# Patient Record
Sex: Female | Born: 1990 | Race: Black or African American | Hispanic: No | Marital: Single | Smoking: Former smoker
Health system: Southern US, Community
[De-identification: ages and names within clinical notes are randomized; demographics above are authoritative.]

## PROBLEM LIST (undated history)

## (undated) DIAGNOSIS — O99019 Anemia complicating pregnancy, unspecified trimester: Secondary | ICD-10-CM

## (undated) DIAGNOSIS — E669 Obesity, unspecified: Secondary | ICD-10-CM

## (undated) HISTORY — PX: ARTHROSCOPIC REPAIR ACL: SUR80

## (undated) HISTORY — PX: SKIN GRAFT: SHX250

---
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
## 2012-08-01 ENCOUNTER — Emergency Department: Payer: Self-pay | Admitting: Emergency Medicine

## 2012-08-06 ENCOUNTER — Emergency Department: Payer: Self-pay | Admitting: Emergency Medicine

## 2012-11-23 ENCOUNTER — Encounter: Payer: Self-pay | Admitting: Maternal and Fetal Medicine

## 2013-01-21 ENCOUNTER — Ambulatory Visit: Payer: Self-pay | Admitting: Physician Assistant

## 2013-03-09 ENCOUNTER — Observation Stay: Payer: Self-pay | Admitting: Obstetrics and Gynecology

## 2013-03-17 ENCOUNTER — Observation Stay: Payer: Self-pay | Admitting: Obstetrics & Gynecology

## 2013-03-17 LAB — URINALYSIS, COMPLETE
Bilirubin,UR: NEGATIVE
Blood: NEGATIVE
Nitrite: NEGATIVE
Ph: 7 (ref 4.5–8.0)
Protein: NEGATIVE
RBC,UR: 1 /HPF (ref 0–5)
Specific Gravity: 1.017 (ref 1.003–1.030)
Squamous Epithelial: 3

## 2013-03-19 LAB — URINE CULTURE

## 2013-05-25 ENCOUNTER — Observation Stay: Payer: Self-pay | Admitting: Obstetrics and Gynecology

## 2013-05-25 LAB — URINALYSIS, COMPLETE
Bilirubin,UR: NEGATIVE
Blood: NEGATIVE
Glucose,UR: NEGATIVE mg/dL (ref 0–75)
Nitrite: NEGATIVE
Protein: NEGATIVE
RBC,UR: 3 /HPF (ref 0–5)
Specific Gravity: 1.013 (ref 1.003–1.030)

## 2013-05-27 LAB — URINE CULTURE

## 2013-06-12 ENCOUNTER — Inpatient Hospital Stay: Payer: Self-pay | Admitting: Obstetrics and Gynecology

## 2013-06-12 LAB — CBC WITH DIFFERENTIAL/PLATELET
Basophil #: 0 10*3/uL (ref 0.0–0.1)
Eosinophil %: 0.4 %
HCT: 33.2 % — ABNORMAL LOW (ref 35.0–47.0)
HGB: 11 g/dL — ABNORMAL LOW (ref 12.0–16.0)
Lymphocyte #: 1.1 10*3/uL (ref 1.0–3.6)
MCHC: 33.2 g/dL (ref 32.0–36.0)
MCV: 85 fL (ref 80–100)
Monocyte #: 0.7 x10 3/mm (ref 0.2–0.9)
Neutrophil #: 9.7 10*3/uL — ABNORMAL HIGH (ref 1.4–6.5)
Neutrophil %: 83.7 %
RBC: 3.89 10*6/uL (ref 3.80–5.20)
RDW: 13.4 % (ref 11.5–14.5)

## 2013-06-12 LAB — GC/CHLAMYDIA PROBE AMP

## 2013-06-13 LAB — HEMATOCRIT: HCT: 27.1 % — ABNORMAL LOW (ref 35.0–47.0)

## 2013-07-18 ENCOUNTER — Emergency Department: Payer: Self-pay | Admitting: Emergency Medicine

## 2014-11-04 ENCOUNTER — Emergency Department: Payer: Self-pay | Admitting: Emergency Medicine

## 2014-11-04 LAB — URINALYSIS, COMPLETE
Bilirubin,UR: NEGATIVE
Blood: NEGATIVE
Glucose,UR: NEGATIVE mg/dL (ref 0–75)
Ketone: NEGATIVE
NITRITE: NEGATIVE
Ph: 5 (ref 4.5–8.0)
Protein: 30
RBC,UR: 4 /HPF (ref 0–5)
SPECIFIC GRAVITY: 1.03 (ref 1.003–1.030)
WBC UR: 28 /HPF (ref 0–5)

## 2014-11-18 NOTE — L&D Delivery Note (Signed)
Obstetrical Delivery Note   Date of Delivery:   06/14/2015 at 1557 Primary OB:   [facility] Gestational Age/EDD: [redacted]w[redacted]d (Dated by LMP) Antepartum complications: anemia, marijuana use, obesity  Delivered By:   Farrel Conners, CNM  Delivery Type:   spontaneous vaginal delivery  Procedure Details:   SVD of viable female infant with nuchal cord x 2 reduced on perineum. Good cry. Baby placed on mother's chest and cord clamped after it was pulseless. FOB then cut cord. Baby placed skin to skin with mother. Anesthesia:    epidural Intrapartum complications: Inadequate labor-Pitocin enhanced labor GBS:    negative Laceration:    none Episiotomy:    none Placenta:    Via active 3rd stage. To pathology: no Estimated Blood Loss:  400 ml  Baby:    Liveborn female, Apgars 8/9, weight pending Roman/ Breast and bottle    Luceil Herrin, CNM

## 2015-01-30 ENCOUNTER — Emergency Department: Payer: Self-pay | Admitting: Emergency Medicine

## 2015-02-03 LAB — OB RESULTS CONSOLE HIV ANTIBODY (ROUTINE TESTING): HIV: NONREACTIVE

## 2015-02-03 LAB — OB RESULTS CONSOLE RPR: RPR: NONREACTIVE

## 2015-02-03 LAB — OB RESULTS CONSOLE VARICELLA ZOSTER ANTIBODY, IGG: Varicella: IMMUNE

## 2015-02-03 LAB — OB RESULTS CONSOLE HEPATITIS B SURFACE ANTIGEN: Hepatitis B Surface Ag: NEGATIVE

## 2015-02-03 LAB — OB RESULTS CONSOLE RUBELLA ANTIBODY, IGM: RUBELLA: IMMUNE

## 2015-03-24 LAB — OB RESULTS CONSOLE HIV ANTIBODY (ROUTINE TESTING): HIV: NONREACTIVE

## 2015-03-24 LAB — OB RESULTS CONSOLE RPR: RPR: NONREACTIVE

## 2015-03-28 NOTE — H&P (Signed)
L&D Evaluation:  History:  HPI 24 yo G1 at 3432w5d by 11w US derived EDC of 06/11/13.  Patient present for decreased fetal movement last movement yesterday.  Has not felt movement until being placed on monitor today.  Also reports some right sides flank/groin pain, positional.  No dysuria, no fevers, no chills.  Denies contractions or other abdominal pain.  No LOF, no VB.   Presents with decreased fetal movement   Patient's Medical History Asthma  obesity   Patient's Surgical History acl repair   Medications Pre Natal Vitamins   Allergies NKDA   Social History tobacco   Family History Non-Contributory   ROS:  ROS All systems were reviewed.  HEENT, CNS, GI, GU, Respiratory, CV, Renal and Musculoskeletal systems were found to be normal.   Exam:  Vital Signs stable   Urine Protein not completed   General no apparent distress   Mental Status clear    Chest no increased work of breathing    Abdomen gravid, non-tender   Estimated Fetal Weight Average for gestational age   Edema no edema    FHT normal rate with no decels, 140, mod, no accels, no decels   Ucx absent   Impression:  Impression decreased fetal movement   Plan:  Plan EFM/NST   Comments - NST appropriate for 26 weeks.  Good fetal movement felt by patient during monitoring. - Discharge home follow up first week of May   Follow Up Appointment already scheduled   Electronic Signatures: Lorrene ReidStaebler, Malya Cirillo M (MD)  (Signed 23-Apr-14 00:30)  Authored: L&D Evaluation   Last Updated: 23-Apr-14 00:30 by Lorrene ReidStaebler, Jesse Hirst M (MD)

## 2015-03-28 NOTE — H&P (Signed)
L&D Evaluation:  History:  HPI 24yo G1 at 60w6d11wk U/S who presents with c/o decreased FM, dysuria and vaginal discharge x 2 days.  No labor complaints.  Now feeling baby move here on L&D.    PNC at [facility].  PNL - O+, RI (MMR x 2), GC/chlam neg, 1hr GTT 89, VI.   Patient's Medical History No Chronic Illness   Patient's Surgical History R knee ACL repair   Medications Pre Natal Vitamins   Allergies NKDA   Social History prior tobacco   Family History Non-Contributory   ROS:  ROS All systems were reviewed.  HEENT, CNS, GI, GU, Respiratory, CV, Renal and Musculoskeletal systems were found to be normal., except as noted in HPI   Exam:  Vital Signs stable   General no apparent distress   Mental Status clear   Chest normal effort   Abdomen gravid, non-tender   Estimated Fetal Weight Average for gestational age   Back no CVAT   Edema no edema   Pelvic no external lesions, cervix closed and thick   Mebranes Intact   FHT normal rate with no decels   Ucx absent   Skin dry   Lymph no lymphadenopathy   Other UA negative except for 2+ leuks   Impression:  Impression 24yo G1 at 260w6dith physiologic discharge   Plan:  Comments - reassurance provided - urine culture - f/u GC/chlam - PTL precautions   Follow Up Appointment already scheduled. in 1 week. at [facility]   Electronic Signatures: StThurmond ButtsErElinor ParkinsonMD)  (Signed 30-Apr-14 11:46)  Authored: L&D Evaluation   Last Updated: 30-Apr-14 11:46 by StThurmond ButtsErElinor ParkinsonMD)

## 2015-05-19 LAB — OB RESULTS CONSOLE GBS: GBS: NEGATIVE

## 2015-05-19 LAB — OB RESULTS CONSOLE GC/CHLAMYDIA
Chlamydia: NEGATIVE
GC PROBE AMP, GENITAL: NEGATIVE

## 2015-06-14 ENCOUNTER — Inpatient Hospital Stay: Payer: Medicaid Other | Admitting: Anesthesiology

## 2015-06-14 ENCOUNTER — Inpatient Hospital Stay
Admission: EM | Admit: 2015-06-14 | Discharge: 2015-06-16 | DRG: 775 | Disposition: A | Discharge status: Home / Self Care | Payer: Medicaid Other | Attending: Certified Nurse Midwife | Admitting: Certified Nurse Midwife

## 2015-06-14 DIAGNOSIS — O9902 Anemia complicating childbirth: Secondary | ICD-10-CM | POA: Diagnosis present

## 2015-06-14 DIAGNOSIS — O99324 Drug use complicating childbirth: Secondary | ICD-10-CM | POA: Diagnosis present

## 2015-06-14 DIAGNOSIS — E669 Obesity, unspecified: Secondary | ICD-10-CM | POA: Diagnosis present

## 2015-06-14 DIAGNOSIS — Z6833 Body mass index (BMI) 33.0-33.9, adult: Secondary | ICD-10-CM | POA: Diagnosis not present

## 2015-06-14 DIAGNOSIS — Z3A39 39 weeks gestation of pregnancy: Secondary | ICD-10-CM | POA: Diagnosis present

## 2015-06-14 DIAGNOSIS — D62 Acute posthemorrhagic anemia: Secondary | ICD-10-CM | POA: Diagnosis not present

## 2015-06-14 DIAGNOSIS — O99214 Obesity complicating childbirth: Secondary | ICD-10-CM | POA: Diagnosis present

## 2015-06-14 DIAGNOSIS — Z3490 Encounter for supervision of normal pregnancy, unspecified, unspecified trimester: Secondary | ICD-10-CM

## 2015-06-14 DIAGNOSIS — F1290 Cannabis use, unspecified, uncomplicated: Secondary | ICD-10-CM | POA: Diagnosis present

## 2015-06-14 DIAGNOSIS — Z87891 Personal history of nicotine dependence: Secondary | ICD-10-CM | POA: Diagnosis not present

## 2015-06-14 DIAGNOSIS — D649 Anemia, unspecified: Secondary | ICD-10-CM | POA: Diagnosis present

## 2015-06-14 HISTORY — DX: Obesity, unspecified: E66.9

## 2015-06-14 HISTORY — DX: Anemia complicating pregnancy, unspecified trimester: O99.019

## 2015-06-14 LAB — URINE DRUG SCREEN, QUALITATIVE (ARMC ONLY)
Amphetamines, Ur Screen: NEGATIVE — AB
BARBITURATES, UR SCREEN: NEGATIVE — AB
Benzodiazepine, Ur Scrn: NEGATIVE — AB
Cannabinoid 50 Ng, Ur ~~LOC~~: POSITIVE — AB
Cocaine Metabolite,Ur ~~LOC~~: NEGATIVE — AB
MDMA (ECSTASY) UR SCREEN: NEGATIVE — AB
Methadone Scn, Ur: NEGATIVE — AB
Opiate, Ur Screen: NEGATIVE — AB
Phencyclidine (PCP) Ur S: NEGATIVE — AB
Tricyclic, Ur Screen: NEGATIVE — AB

## 2015-06-14 LAB — TYPE AND SCREEN
ABO/RH(D): O POS
ANTIBODY SCREEN: NEGATIVE

## 2015-06-14 LAB — CBC
HCT: 30.6 % — ABNORMAL LOW (ref 35.0–47.0)
Hemoglobin: 9.7 g/dL — ABNORMAL LOW (ref 12.0–16.0)
MCH: 25.5 pg — AB (ref 26.0–34.0)
MCHC: 31.6 g/dL — ABNORMAL LOW (ref 32.0–36.0)
MCV: 80.8 fL (ref 80.0–100.0)
PLATELETS: 106 10*3/uL — AB (ref 150–440)
RBC: 3.78 MIL/uL — AB (ref 3.80–5.20)
RDW: 15.6 % — ABNORMAL HIGH (ref 11.5–14.5)
WBC: 7.3 10*3/uL (ref 3.6–11.0)

## 2015-06-14 LAB — CHLAMYDIA/NGC RT PCR (ARMC ONLY)
CHLAMYDIA TR: NOT DETECTED
N gonorrhoeae: NOT DETECTED

## 2015-06-14 LAB — ABO/RH: ABO/RH(D): O POS

## 2015-06-14 MED ORDER — SODIUM CHLORIDE 0.9 % IJ SOLN
INTRAMUSCULAR | Status: AC
  Filled 2015-06-14: qty 10

## 2015-06-14 MED ORDER — ONDANSETRON HCL 4 MG/2ML IJ SOLN
4.0000 mg | Freq: Four times a day (QID) | INTRAMUSCULAR | Status: DC | PRN
  Administered 2015-06-14: 4 mg via INTRAVENOUS
  Filled 2015-06-14: qty 2

## 2015-06-14 MED ORDER — MISOPROSTOL 200 MCG PO TABS
800.0000 ug | ORAL_TABLET | Freq: Once | ORAL | Status: DC | PRN
  Filled 2015-06-14: qty 4

## 2015-06-14 MED ORDER — DIPHENHYDRAMINE HCL 50 MG/ML IJ SOLN: 12.5000 mg | INTRAMUSCULAR | Status: DC | PRN

## 2015-06-14 MED ORDER — FERROUS SULFATE 325 (65 FE) MG PO TABS
325.0000 mg | ORAL_TABLET | Freq: Every day | ORAL | Status: DC
  Administered 2015-06-15: 325 mg via ORAL
  Filled 2015-06-14 (×2): qty 1

## 2015-06-14 MED ORDER — OXYCODONE-ACETAMINOPHEN 5-325 MG PO TABS
2.0000 | ORAL_TABLET | ORAL | Status: DC | PRN
  Filled 2015-06-14: qty 2

## 2015-06-14 MED ORDER — WITCH HAZEL-GLYCERIN EX PADS: 1.0000 "application " | MEDICATED_PAD | CUTANEOUS | Status: DC | PRN

## 2015-06-14 MED ORDER — TERBUTALINE SULFATE 1 MG/ML IJ SOLN
0.2500 mg | Freq: Once | INTRAMUSCULAR | Status: DC | PRN
  Filled 2015-06-14: qty 1

## 2015-06-14 MED ORDER — OXYCODONE-ACETAMINOPHEN 5-325 MG PO TABS
1.0000 | ORAL_TABLET | ORAL | Status: DC | PRN
  Administered 2015-06-15 – 2015-06-16 (×2): 1 via ORAL
  Filled 2015-06-14: qty 1

## 2015-06-14 MED ORDER — IBUPROFEN 600 MG PO TABS
600.0000 mg | ORAL_TABLET | Freq: Four times a day (QID) | ORAL | Status: DC
  Administered 2015-06-14: 600 mg via ORAL

## 2015-06-14 MED ORDER — LACTATED RINGERS IV SOLN
INTRAVENOUS | Status: DC
  Administered 2015-06-14 (×2): via INTRAVENOUS

## 2015-06-14 MED ORDER — OXYTOCIN 40 UNITS IN LACTATED RINGERS INFUSION - SIMPLE MED
1.0000 m[IU]/min | INTRAVENOUS | Status: DC
  Administered 2015-06-14: 1 m[IU]/min via INTRAVENOUS

## 2015-06-14 MED ORDER — LACTATED RINGERS IV SOLN: 500.0000 mL | INTRAVENOUS | Status: DC | PRN

## 2015-06-14 MED ORDER — LIDOCAINE HCL (PF) 1 % IJ SOLN
INTRAMUSCULAR | Status: DC | PRN
  Administered 2015-06-14: 3 mL via INTRADERMAL

## 2015-06-14 MED ORDER — OXYTOCIN 40 UNITS IN LACTATED RINGERS INFUSION - SIMPLE MED: 62.5000 mL/h | INTRAVENOUS | Status: DC | PRN

## 2015-06-14 MED ORDER — PHENYLEPHRINE 40 MCG/ML (10ML) SYRINGE FOR IV PUSH (FOR BLOOD PRESSURE SUPPORT)
80.0000 ug | PREFILLED_SYRINGE | INTRAVENOUS | Status: DC | PRN
  Filled 2015-06-14: qty 2

## 2015-06-14 MED ORDER — PRENATAL MULTIVITAMIN CH
1.0000 | ORAL_TABLET | Freq: Every day | ORAL | Status: DC
  Administered 2015-06-15: 1 via ORAL
  Filled 2015-06-14: qty 1

## 2015-06-14 MED ORDER — EPHEDRINE 5 MG/ML INJ
10.0000 mg | INTRAVENOUS | Status: DC | PRN
  Administered 2015-06-14: 10 mg via INTRAVENOUS
  Filled 2015-06-14: qty 2

## 2015-06-14 MED ORDER — OXYTOCIN BOLUS FROM INFUSION: 500.0000 mL | INTRAVENOUS | Status: DC

## 2015-06-14 MED ORDER — ONDANSETRON HCL 4 MG/2ML IJ SOLN: 4.0000 mg | INTRAMUSCULAR | Status: DC | PRN

## 2015-06-14 MED ORDER — BUPIVACAINE HCL (PF) 0.25 % IJ SOLN
INTRAMUSCULAR | Status: DC | PRN
  Administered 2015-06-14 (×2): 5 mL

## 2015-06-14 MED ORDER — EPHEDRINE SULFATE 50 MG/ML IJ SOLN
INTRAMUSCULAR | Status: AC
  Filled 2015-06-14: qty 1

## 2015-06-14 MED ORDER — SIMETHICONE 80 MG PO CHEW: 80.0000 mg | CHEWABLE_TABLET | ORAL | Status: DC | PRN

## 2015-06-14 MED ORDER — DIBUCAINE 1 % RE OINT: 1.0000 "application " | TOPICAL_OINTMENT | RECTAL | Status: DC | PRN

## 2015-06-14 MED ORDER — LIDOCAINE-EPINEPHRINE (PF) 1.5 %-1:200000 IJ SOLN
INTRAMUSCULAR | Status: DC | PRN
  Administered 2015-06-14: 3 mL via EPIDURAL

## 2015-06-14 MED ORDER — FENTANYL CITRATE (PF) 100 MCG/2ML IJ SOLN
50.0000 ug | INTRAMUSCULAR | Status: DC | PRN
  Administered 2015-06-14 (×2): 100 ug via INTRAVENOUS
  Filled 2015-06-14 (×2): qty 2

## 2015-06-14 MED ORDER — ONDANSETRON HCL 4 MG PO TABS: 4.0000 mg | ORAL_TABLET | ORAL | Status: DC | PRN

## 2015-06-14 MED ORDER — BENZOCAINE-MENTHOL 20-0.5 % EX AERO: 1.0000 "application " | INHALATION_SPRAY | CUTANEOUS | Status: DC | PRN

## 2015-06-14 MED ORDER — LIDOCAINE HCL (PF) 1 % IJ SOLN
30.0000 mL | INTRAMUSCULAR | Status: DC | PRN
  Filled 2015-06-14: qty 30

## 2015-06-14 MED ORDER — AMMONIA AROMATIC IN INHA
0.3000 mL | Freq: Once | RESPIRATORY_TRACT | Status: DC | PRN
  Filled 2015-06-14: qty 10

## 2015-06-14 MED ORDER — OXYTOCIN 40 UNITS IN LACTATED RINGERS INFUSION - SIMPLE MED
62.5000 mL/h | INTRAVENOUS | Status: DC
  Filled 2015-06-14: qty 1000

## 2015-06-14 MED ORDER — LANOLIN HYDROUS EX OINT: TOPICAL_OINTMENT | CUTANEOUS | Status: DC | PRN

## 2015-06-14 MED ORDER — DOCUSATE SODIUM 100 MG PO CAPS
100.0000 mg | ORAL_CAPSULE | Freq: Every day | ORAL | Status: DC
  Administered 2015-06-14 – 2015-06-15 (×2): 100 mg via ORAL
  Filled 2015-06-14 (×2): qty 1

## 2015-06-14 MED ORDER — IBUPROFEN 600 MG PO TABS
600.0000 mg | ORAL_TABLET | Freq: Four times a day (QID) | ORAL | Status: DC
  Administered 2015-06-15 – 2015-06-16 (×4): 600 mg via ORAL
  Filled 2015-06-14 (×4): qty 1

## 2015-06-14 MED ORDER — FENTANYL 2.5 MCG/ML W/ROPIVACAINE 0.2% IN NS 100 ML EPIDURAL INFUSION (ARMC-ANES)
9.0000 mL/h | EPIDURAL | Status: DC
  Administered 2015-06-14: 9 mL/h via EPIDURAL
  Filled 2015-06-14: qty 100

## 2015-06-14 NOTE — Anesthesia Preprocedure Evaluation (Addendum)
Anesthesia Evaluation  Patient identified by MRN, date of birth, ID band Patient awake    Reviewed: Allergy & Precautions, H&P , NPO status , Patient's Chart, lab work & pertinent test results, reviewed documented beta blocker date and time   Airway Mallampati: III  TM Distance: >3 FB Neck ROM: full    Dental no notable dental hx. (+) Teeth Intact   Pulmonary neg pulmonary ROS, former smoker,  breath sounds clear to auscultation  Pulmonary exam normal       Cardiovascular Exercise Tolerance: Good negative cardio ROS Normal cardiovascular examRhythm:regular Rate:Normal     Neuro/Psych negative neurological ROS  negative psych ROS   GI/Hepatic negative GI ROS, Neg liver ROS,   Endo/Other  negative endocrine ROS  Renal/GU negative Renal ROS  negative genitourinary   Musculoskeletal   Abdominal   Peds  Hematology negative hematology ROS (+)   Anesthesia Other Findings   Reproductive/Obstetrics (+) Pregnancy                             Anesthesia Physical Anesthesia Plan  ASA: II  Anesthesia Plan: Regional and Epidural   Post-op Pain Management:    Induction:   Airway Management Planned:   Additional Equipment:   Intra-op Plan:   Post-operative Plan:   Informed Consent: I have reviewed the patients History and Physical, chart, labs and discussed the procedure including the risks, benefits and alternatives for the proposed anesthesia with the patient or authorized representative who has indicated his/her understanding and acceptance.     Plan Discussed with: CRNA  Anesthesia Plan Comments:         Anesthesia Quick Evaluation  

## 2015-06-14 NOTE — H&P (Signed)
OB History & Physical   History of Present Illness:  Chief Complaint:  Complains of contractions every 5-10 min apart since 0530. No bleeding or LOF. HPI:  Virginia Doyle is a 24 y.o. G75P1001 female with EDC=06/15/2015 at [redacted]w[redacted]d dated by LMP=21 week ultrasound.  Her pregnancy has been complicated by late entry to care, obesity (BMI=33 with another 33# weight gain since entry to care), MJ use, and anemia..  She presents to L&D for evaluation of contractions..    Prenatal care site: Prenatal care at [facility] since 19wk6d       Maternal Medical History:   Past Medical History  Diagnosis Date  . Obesity   . Anemia affecting pregnancy     Past Surgical History  Procedure Laterality Date  . Arthroscopic repair acl Right   . Skin graft Left     pinkie    No Known Allergies  Prior to Admission medications   Medication Sig Start Date End Date Taking? Authorizing Provider  Prenatal Vit-Fe Fumarate-FA (PRENATAL MULTIVITAMIN) TABS tablet Take 1 tablet by mouth daily at 12 noon.   Yes Historical Provider, MD          Social History: She  reports that she has quit smoking. She has never used smokeless tobacco. She reports that she uses illicit drugs (Marijuana). She reports that she does not drink alcohol.  Family History: family history is not on file.   Review of Systems: Negative x 10 systems reviewed except as noted in the HPI.      Physical Exam:  Vital Signs: BP 127/75 mmHg  Pulse 65  Temp(Src) 98 F (36.7 C) (Oral)  Resp 18  Ht  (1.676 m)  Wt 222 lb (100.699 kg)  BMI 35.85 kg/m2  LMP 09/08/2014 General: no acute distress.  HEENT: normocephalic, atraumatic Heart: regular rate & rhythm.  No murmurs Lungs: clear to auscultation bilaterally Abdomen: soft, gravid, non-tender;  EFW: 7 1/2# Pelvic:   External: Normal external female genitalia  Cervix: Dilation: 5.5 / Effacement (%): 70 / Station: -1 /vtx per RN exam  Extremities: non-tender,  symmetric, +1edema bilaterally.  DTRs: +2 Neurologic: Alert & oriented x 3.    Pertinent Results:  Prenatal Labs: Blood type/Rh O positive  Antibody screen negative  Rubella Varicella Immune Immune  RPR negative  HBsAg negative  HIV negative  GC negative  Chlamydia negative  Genetic screening N/A  1 hour GTT 84/  117  3 hour GTT N/A  GBS negative on 7/1   Baseline FHR: 125 baseline with accelerations to 150s to 160s and no decelerations.  Contraction q2-8 min apart, difficulty picking some up with toco.  Assessment:  Virginia Doyle is a 25 y.o. G2P1001 female at [redacted]w[redacted]d in early labor. Cat 1 fetal heart tracing  Plan:  1. Admit to Labor & Delivery-monitor progress and fetal well being. 2. CBC, T&S, Clrs, IVF 3. GBS negative   4. Consents obtained. 5. Desires IV analgesia for pain  6. Can ambulate and monitor intermittently  Mikeya Tomasetti  06/14/2015 8:23 AM

## 2015-06-14 NOTE — Progress Notes (Signed)
Labor and delivery progress note  S: Slept after Fentanyl, and requesting more pain meds as her contractions have returned  O: BP 111/66 mmHg  Pulse 65  Temp(Src) 98 F (36.7 C) (Oral)  Resp 18  Ht  (1.676 m)  Wt 222 lb (100.699 kg)  BMI 35.85 kg/m2  LMP 09/08/2014   FHR reasurring with baseline 120s and accelerations, no decelerations  Toco: has not been picking up contractions when sleeping, but the last two were two minutes apart  Cervix: 5/90%/-1  A: No progress since admission 3-4 hours ago  P: Pitocin augmentation, then AROM when progressing Patient agrees with POM.  Farrel Conners, CNM

## 2015-06-14 NOTE — Discharge Summary (Signed)
Physician Obstetric Discharge Summary  Patient ID: Virginia Doyle MRN: [id number] DOB/AGE: [birth year] 24 y.o.   Date of Admission: 06/14/2015  Day of Delivery: 06/14/2015 at 1557  Date of Discharge:   Admitting Diagnosis: Onset of Labor at [redacted]w[redacted]d  Secondary Diagnosis: Anemia in pregnancy, Inadequate labor  Mode of Delivery: normal spontaneous vaginal delivery     Discharge Diagnosis: Term pregnancy - delivered   Intrapartum Procedures: pitocin augmentation and amniotomy   Post partum procedures: routine postpartum care  Complications: none   Brief Hospital Course  SHAMIRAH IVAN is a [id number] who had a SVD on 06/14/15;  for further details of this delivery, please refer to the delivery note.  Patient had an uncomplicated postpartum course.  By time of discharge on PPD#2, her pain was controlled on oral pain medications; she had appropriate lochia and was ambulating, voiding without difficulty and tolerating regular diet.  She was deemed stable for discharge to home.     Labs: CBC Latest Ref Rng 06/15/2015 06/14/2015 06/13/2013  WBC 3.6 - 11.0 K/uL 8.9 7.3 -  Hemoglobin 12.0 - 16.0 g/dL 7.7(L) 9.7(L) -  Hematocrit 35.0 - 47.0 % 24.4(L) 30.6(L) 27.1(L)  Platelets 150 - 440 K/uL 85(L) 106(L) -   O POS  Physical exam:  Blood pressure 135/87, pulse 63, temperature 98.4 F (36.9 C), temperature source Oral, resp. rate 16, height  (1.676 m), weight 100.699 kg (222 lb), last menstrual period 09/08/2014, SpO2 100 %, unknown if currently breastfeeding. General: alert and no distress Lochia: appropriate Abdomen: soft, NT Uterine Fundus: firm Extremities: No evidence of DVT seen on physical exam. No lower extremity edema.  Discharge Instructions: Per After Visit Summary. Activity: Advance as tolerated. Pelvic rest for 6 weeks.  Also refer to Discharge Instructions Diet: Regular Medications:   Medication List    TAKE these medications        ferrous sulfate 325 (65 FE)  MG tablet  Commonly known as:  FERROUSUL  Take 1 tablet (325 mg total) by mouth 2 (two) times daily.     ibuprofen 600 MG tablet  Commonly known as:  ADVIL,MOTRIN  Take 1 tablet (600 mg total) by mouth every 6 (six) hours.     prenatal multivitamin Tabs tablet  Take 1 tablet by mouth daily at 12 noon.       Outpatient follow up:  Follow-up Information    Follow up with GUTIERREZ, COLLEEN, CNM In 6 weeks.   Specialty:  Certified Nurse Midwife   Why:  postpartum check   Contact information:   [address] [place] [address] [phone]      Postpartum contraception: Nexplanon  Discharged Condition: good  Discharged to: home   Newborn Data: Disposition:home with mother  Apgars: APGAR (1 MIN): 9   APGAR (5 MINS): 9   APGAR (10 MINS):    Baby Feeding: Breast  Vena Austria, MD

## 2015-06-14 NOTE — OB Triage Note (Signed)
Pt arrives with c/o contractions since 5:30 AM, denies leaking fluid, bleeding. +FM.

## 2015-06-14 NOTE — Lactation Note (Signed)
This note was copied from the chart of Virginia Doyle. Lactation Consultation Note  Patient Name: Virginia Doyle Today's Date: 06/14/2015     Maternal Data  Mom + for marijuana at delivery: I called pediatrician, Dr Noralyn Pick who recommends pump and dump until Mom tests - for marijuana. (Info shared with CNM Mikal Plane who plans to discuss this with mother.  Feeding  Breastfeeding not recommended until - for marijuana  [facility] Score/Interventions                      Lactation Tools Discussed/Used     Consult Status      Sunday Corn 06/14/2015, 4:58 PM

## 2015-06-14 NOTE — Anesthesia Procedure Notes (Signed)
Epidural Patient location during procedure: OB Start time: 06/14/2015 2:03 PM End time: 06/14/2015 2:30 PM  Staffing Resident/CRNA: Darnell Stimson Performed by: resident/CRNA   Preanesthetic Checklist Completed: patient identified, site marked, surgical consent, pre-op evaluation, timeout performed, IV checked, risks and benefits discussed and monitors and equipment checked  Epidural Patient position: sitting Prep: Betadine Patient monitoring: heart rate, continuous pulse ox and blood pressure Approach: midline Location: L4-L5 Injection technique: LOR saline  Needle:  Needle type: Tuohy  Needle gauge: 18 G Needle length: 9 cm and 9 Needle insertion depth: 7 cm Catheter type: closed end flexible Catheter size: 20 Guage Catheter at skin depth: 12 cm Test dose: negative and 1.5% lidocaine with Epi 1:200 K  Assessment Sensory level: T10 Events: blood not aspirated, injection not painful, no injection resistance, negative IV test and no paresthesia  Additional Notes   Patient tolerated the insertion well without complications.Reason for block:procedure for pain

## 2015-06-15 LAB — CBC
HEMATOCRIT: 24.4 % — AB (ref 35.0–47.0)
Hemoglobin: 7.7 g/dL — ABNORMAL LOW (ref 12.0–16.0)
MCH: 25.7 pg — AB (ref 26.0–34.0)
MCHC: 31.6 g/dL — AB (ref 32.0–36.0)
MCV: 81.1 fL (ref 80.0–100.0)
PLATELETS: 85 10*3/uL — AB (ref 150–440)
RBC: 3.01 MIL/uL — ABNORMAL LOW (ref 3.80–5.20)
RDW: 15.3 % — ABNORMAL HIGH (ref 11.5–14.5)
WBC: 8.9 10*3/uL (ref 3.6–11.0)

## 2015-06-15 LAB — RPR: RPR: NONREACTIVE

## 2015-06-15 NOTE — Anesthesia Postprocedure Evaluation (Signed)
  Anesthesia Post-op Note  Patient: Virginia Doyle  Procedure(s) Performed: * No procedures listed *  Anesthesia type:Regional, Epidural  Patient location: 337  Post pain: Pain level controlled  Post assessment: Post-op Vital signs reviewed, Patient's Cardiovascular Status Stable, Respiratory Function Stable, Patent Airway and No signs of Nausea or vomiting  Post vital signs: Reviewed and stable  Last Vitals:  Filed Vitals:   06/15/15 0743  BP: 104/58  Pulse: 68  Temp: 36.7 C  Resp: 20    Level of consciousness: awake, alert  and patient cooperative  Complications: No apparent anesthesia complications

## 2015-06-15 NOTE — Progress Notes (Signed)
Subjective:  Doing well, minimal lochia.  Denies feeling dizzy or lighheaded  Objective:   Blood pressure 104/58, pulse 68, temperature 98.1 F (36.7 C), temperature source Oral, resp. rate 20, height 5\' 6"  (1.676 m), weight 100.699 kg (222 lb), last menstrual period 09/08/2014, SpO2 99 %, unknown if currently breastfeeding.  General: NAD Pulmonary: no increased work of breathing Abdomen: non-distended, non-tender, fundus firm at level of umbilicus Extremities: no edema, no erythema, no tenderness  Results for orders placed or performed during the hospital encounter of 06/14/15 (from the past 72 hour(s))  CBC     Status: Abnormal   Collection Time: 06/14/15  9:24 AM  Result Value Ref Range   WBC 7.3 3.6 - 11.0 K/uL   RBC 3.78 (L) 3.80 - 5.20 MIL/uL   Hemoglobin 9.7 (L) 12.0 - 16.0 g/dL   HCT 16.1 (L) 09.6 - 04.5 %   MCV 80.8 80.0 - 100.0 fL   MCH 25.5 (L) 26.0 - 34.0 pg   MCHC 31.6 (L) 32.0 - 36.0 g/dL   RDW 40.9 (H) 81.1 - 91.4 %   Platelets 106 (L) 150 - 440 K/uL  Type and screen     Status: None   Collection Time: 06/14/15  9:24 AM  Result Value Ref Range   ABO/RH(D) O POS    Antibody Screen NEG    Sample Expiration 06/17/2015   RPR     Status: None   Collection Time: 06/14/15  9:24 AM  Result Value Ref Range   RPR Ser Ql Non Reactive Non Reactive    Comment: (NOTE) Performed At: [facility] [address] [place], [place] [id number] Mila Homer MD [id number]   Urine Drug Screen, Qualitative ([facility] only)     Status: Abnormal   Collection Time: 06/14/15  9:24 AM  Result Value Ref Range   Tricyclic, Ur Screen NEGATIVE (A) NONE DETECTED   Amphetamines, Ur Screen NEGATIVE (A) NONE DETECTED   MDMA (Ecstasy)Ur Screen NEGATIVE (A) NONE DETECTED   Cocaine Metabolite,Ur [place] NEGATIVE (A) NONE DETECTED   Opiate, Ur Screen NEGATIVE (A) NONE DETECTED   Phencyclidine (PCP) Ur S NEGATIVE (A) NONE DETECTED   Cannabinoid 50 Ng, Ur Flowella POSITIVE (A) NONE DETECTED   Barbiturates, Ur Screen NEGATIVE (A) NONE DETECTED   Benzodiazepine, Ur Scrn NEGATIVE (A) NONE DETECTED   Methadone Scn, Ur NEGATIVE (A) NONE DETECTED    Comment: (NOTE) 100  Tricyclics, urine               Cutoff 1000 ng/mL 200  Amphetamines, urine             Cutoff 1000 ng/mL 300  MDMA (Ecstasy), urine           Cutoff 500 ng/mL 400  Cocaine Metabolite, urine       Cutoff 300 ng/mL 500  Opiate, urine                   Cutoff 300 ng/mL 600  Phencyclidine (PCP), urine      Cutoff 25 ng/mL 700  Cannabinoid, urine              Cutoff 50 ng/mL 800  Barbiturates, urine             Cutoff 200 ng/mL 900  Benzodiazepine, urine           Cutoff 200 ng/mL 1000 Methadone, urine                Cutoff 300 ng/mL  1100 1200 The urine drug screen provides only a preliminary, unconfirmed 1300 analytical test result and should not be used for non-medical 1400 purposes. Clinical consideration and professional judgment should 1500 be applied to any positive drug screen result due to possible 1600 interfering substances. A more specific alternate chemical method 1700 must be used in order to obtain a confirmed analytical result.  1800 Gas chromato graphy / mass spectrometry (GC/MS) is the preferred 1900 confirmatory method.   Chlamydia/NGC rt PCR ([facility] only)     Status: None   Collection Time: 06/14/15  9:24 AM  Result Value Ref Range   Specimen source GC/Chlam URINE, RANDOM    Chlamydia Tr NOT DETECTED NOT DETECTED   N gonorrhoeae NOT DETECTED NOT DETECTED  ABO/Rh     Status: None   Collection Time: 06/14/15  9:25 AM  Result Value Ref Range   ABO/RH(D) O POS   CBC     Status: Abnormal   Collection Time: 06/15/15  5:11 AM  Result Value Ref Range   WBC 8.9 3.6 - 11.0 K/uL   RBC 3.01 (L) 3.80 - 5.20 MIL/uL   Hemoglobin 7.7 (L) 12.0 - 16.0 g/dL   HCT 40.9 (L) 81.1 - 91.4 %   MCV 81.1 80.0 - 100.0 fL   MCH 25.7 (L) 26.0 - 34.0 pg   MCHC 31.6 (L) 32.0 - 36.0 g/dL   RDW 78.2 (H) 95.6 - 21.3 %    Platelets 85 (L) 150 - 440 K/uL    Assessment:   24 y.o. G2P2002 postpartum day #1 TSVD  Plan:    1) Acute blood loss anemia - hemodynamically stable and asymptomatic - po ferrous sulfate - appropriate drop   2) O pos / RI / VZI  3) TDAP status up to date  4) Bottle/Nexplanon  5) Disposition - anticpate D/C PPD#2

## 2015-06-16 MED ORDER — IBUPROFEN 600 MG PO TABS: 600.0000 mg | ORAL_TABLET | Freq: Four times a day (QID) | ORAL | Status: DC

## 2015-06-16 MED ORDER — FERROUS SULFATE 325 (65 FE) MG PO TABS: 325.0000 mg | ORAL_TABLET | Freq: Two times a day (BID) | ORAL | Status: DC

## 2015-06-16 NOTE — Progress Notes (Signed)
pt discharged home with infant.  Discharge instructions, prescriptions and follow up appointment given to and reviewed with pt.  Pt verbalized understanding, all questions answered.  Escorted by auxiliary. 

## 2015-06-16 NOTE — Discharge Instructions (Signed)
Call for heavy bleeding, increasing abdominal pain, shortness of breath, chest pain, a fever greater than 101, or any concerns about depression.  No strenuous activity until cleared by MD.  Eat a well-balanced diet and drink 8-10 glasses of water per day.  Continue taking iron for 6 weeks and prenatal vitamins while breastfeeding.  No tampons, douches or sexual intercourse for 6 weeks. ° ° °

## 2015-07-25 LAB — HM PAP SMEAR: HM PAP: NEGATIVE

## 2016-02-18 ENCOUNTER — Encounter: Payer: Self-pay | Admitting: Emergency Medicine

## 2016-02-18 DIAGNOSIS — M79661 Pain in right lower leg: Secondary | ICD-10-CM | POA: Insufficient documentation

## 2016-02-18 DIAGNOSIS — F17200 Nicotine dependence, unspecified, uncomplicated: Secondary | ICD-10-CM | POA: Diagnosis not present

## 2016-02-18 DIAGNOSIS — Z791 Long term (current) use of non-steroidal anti-inflammatories (NSAID): Secondary | ICD-10-CM | POA: Diagnosis not present
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
## 2016-02-19 ENCOUNTER — Emergency Department: Payer: BLUE CROSS/BLUE SHIELD

## 2016-02-19 ENCOUNTER — Emergency Department
Admission: EM | Admit: 2016-02-19 | Discharge: 2016-02-19 | Disposition: A | Discharge status: Home / Self Care | Payer: BLUE CROSS/BLUE SHIELD | Attending: Emergency Medicine | Admitting: Emergency Medicine

## 2016-02-19 DIAGNOSIS — R52 Pain, unspecified: Secondary | ICD-10-CM

## 2016-02-19 DIAGNOSIS — M79604 Pain in right leg: Secondary | ICD-10-CM

## 2016-02-19 DIAGNOSIS — M7918 Myalgia, other site: Secondary | ICD-10-CM

## 2016-02-19 MED ORDER — TRAMADOL HCL 50 MG PO TABS
50.0000 mg | ORAL_TABLET | Freq: Once | ORAL | Status: AC
  Administered 2016-02-19: 50 mg via ORAL
  Filled 2016-02-19: qty 1
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
## 2016-02-19 NOTE — Discharge Instructions (Signed)

## 2017-07-07 ENCOUNTER — Ambulatory Visit: Payer: Self-pay | Admitting: Obstetrics and Gynecology

## 2017-12-15 ENCOUNTER — Other Ambulatory Visit: Payer: Self-pay

## 2017-12-15 ENCOUNTER — Encounter: Payer: Self-pay | Admitting: Emergency Medicine

## 2017-12-15 ENCOUNTER — Emergency Department: Payer: BLUE CROSS/BLUE SHIELD

## 2017-12-15 ENCOUNTER — Emergency Department
Admission: EM | Admit: 2017-12-15 | Discharge: 2017-12-15 | Disposition: A | Discharge status: Home / Self Care | Payer: BLUE CROSS/BLUE SHIELD | Attending: Emergency Medicine | Admitting: Emergency Medicine

## 2017-12-15 DIAGNOSIS — W208XXA Other cause of strike by thrown, projected or falling object, initial encounter: Secondary | ICD-10-CM | POA: Diagnosis not present

## 2017-12-15 DIAGNOSIS — Z87891 Personal history of nicotine dependence: Secondary | ICD-10-CM | POA: Insufficient documentation

## 2017-12-15 DIAGNOSIS — S92425A Nondisplaced fracture of distal phalanx of left great toe, initial encounter for closed fracture: Secondary | ICD-10-CM

## 2017-12-15 DIAGNOSIS — Y929 Unspecified place or not applicable: Secondary | ICD-10-CM | POA: Insufficient documentation

## 2017-12-15 DIAGNOSIS — Y9389 Activity, other specified: Secondary | ICD-10-CM | POA: Diagnosis not present

## 2017-12-15 DIAGNOSIS — Y998 Other external cause status: Secondary | ICD-10-CM | POA: Insufficient documentation

## 2017-12-15 DIAGNOSIS — Z79899 Other long term (current) drug therapy: Secondary | ICD-10-CM | POA: Insufficient documentation

## 2017-12-15 DIAGNOSIS — S99922A Unspecified injury of left foot, initial encounter: Secondary | ICD-10-CM | POA: Diagnosis present

## 2017-12-15 MED ORDER — TRAMADOL HCL 50 MG PO TABS
50.0000 mg | ORAL_TABLET | Freq: Four times a day (QID) | ORAL | 0 refills | Status: DC | PRN
Start: 2017-12-15 — End: 2018-08-10

## 2017-12-15 MED ORDER — NAPROXEN 500 MG PO TABS: 500.0000 mg | ORAL_TABLET | Freq: Two times a day (BID) | ORAL | 0 refills | Status: DC

## 2017-12-15 NOTE — ED Triage Notes (Signed)
Dropped TV on foot.  Pain to right great toe.  Ambulatory with crutches.
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
## 2017-12-15 NOTE — Discharge Instructions (Signed)
Buddy tape toe and ambulate with crutches for 2 weeks

## 2017-12-15 NOTE — ED Provider Notes (Signed)
[facility] Emergency Department Provider Note   ____________________________________________   None    (approximate)  I have reviewed the triage vital signs and the nursing notes.   HISTORY  Chief Complaint Foot Pain    HPI Virginia Doyle is a 27 y.o. female patient complains of pain to the left great toe status post dropping TV on digit.  Incident occurred prior to arrival.  Patient rates pain as a 6/10.  Patient described the pain as "achy".  Pain increases with weightbearing so the patient is using crutches.  Patient took 800 mg ibuprofen prior to arrival.   Past Medical History:  Diagnosis Date  . Anemia affecting pregnancy   . Obesity     Patient Active Problem List   Diagnosis Date Noted  . Postpartum care following vaginal delivery 06/14/2015    Past Surgical History:  Procedure Laterality Date  . ARTHROSCOPIC REPAIR ACL Right   . SKIN GRAFT Left    pinkie    Prior to Admission medications   Medication Sig Start Date End Date Taking? Authorizing Provider  ferrous sulfate (FERROUSUL) 325 (65 FE) MG tablet Take 1 tablet (325 mg total) by mouth 2 (two) times daily. 06/16/15   Vena AustriaStaebler, Andreas, MD  ibuprofen (ADVIL,MOTRIN) 600 MG tablet Take 1 tablet (600 mg total) by mouth every 6 (six) hours. 06/16/15   Vena AustriaStaebler, Andreas, MD  naproxen (NAPROSYN) 500 MG tablet Take 1 tablet (500 mg total) by mouth 2 (two) times daily with a meal. 12/15/17   Joni ReiningSmith, Karlynn Furrow K, PA-C  Prenatal Vit-Fe Fumarate-FA (PRENATAL MULTIVITAMIN) TABS tablet Take 1 tablet by mouth daily at 12 noon.    [provider]  traMADol (ULTRAM) 50 MG tablet Take 1 tablet (50 mg total) by mouth every 6 (six) hours as needed. 12/15/17 12/15/18  Joni ReiningSmith, Rosmarie Esquibel K, PA-C    Allergies Patient has no known allergies.  Family History  Problem Relation Age of Onset  . Diabetes Father        TYPE 2  . Cancer Father        LYMPHOMA  . Breast cancer Maternal Grandmother   .  Diabetes Paternal Grandfather        TYPE 2    Social History Social History   Tobacco Use  . Smoking status: Former Games developermoker  . Smokeless tobacco: Never Used  Substance Use Topics  . Alcohol use: No    Alcohol/week: 0.0 oz  . Drug use: No    Review of Systems Constitutional: No fever/chills Eyes: No visual changes. ENT: No sore throat. Cardiovascular: Denies chest pain. Respiratory: Denies shortness of breath. Gastrointestinal: No abdominal pain.  No nausea, no vomiting.  No diarrhea.  No constipation. Genitourinary: Negative for dysuria. Musculoskeletal: Left great toe pain. Skin: Negative for rash. Neurological: Negative for headaches, focal weakness or numbness.   ____________________________________________   PHYSICAL EXAM:  VITAL SIGNS: ED Triage Vitals [12/15/17 1129]  Enc Vitals Group     BP      Pulse      Resp      Temp      Temp src      SpO2      Weight 180 lb (81.6 kg)     Height 5\' 6"  (1.676 m)     Head Circumference      Peak Flow      Pain Score 7     Pain Loc      Pain Edu?  Excl. in GC?    Constitutional: Alert and oriented. Well appearing and in no acute distress. Cardiovascular: Normal rate, regular rhythm. Grossly normal heart sounds.  Good peripheral circulation. Respiratory: Normal respiratory effort.  No retractions. Lungs CTAB. Musculoskeletal: No obvious deformity to left great toe.  Patient has moderate guarding palpation.  Patient decreased range of motion for flexion extension.  Neurologic:  Normal speech and language. No gross focal neurologic deficits are appreciated. No gait instability. Skin:  Skin is warm, dry and intact. No rash noted. Psychiatric: Mood and affect are normal. Speech and behavior are normal.  ____________________________________________   LABS (all labs ordered are listed, but only abnormal results are displayed)  Labs Reviewed - No data to  display ____________________________________________  EKG   ____________________________________________  RADIOLOGY  Dg Toe Great Left  Result Date: 12/15/2017 CLINICAL DATA:  Dropped heavy object on toe.  Pain. EXAM: LEFT GREAT TOE COMPARISON:  None. FINDINGS: There is a vertical linear density within the proximal phalanx the great toe extending to the joint space of the interphalangeal joint concerning for a nondisplaced fracture. In addition, there is a lucency across a lateral projecting process of the distal phalanx, also at the interphalangeal joint, adjacent to the second toe which could represent an additional fracture. There is no dislocation. There may be mild soft tissue swelling. IMPRESSION: Suspected fractures of the proximal and distal phalanx of the great toe as described above. Vertical fracture of the proximal phalanx appears to extend to the interphalangeal joint. Suspected nondisplaced fracture of the proximal aspect distal phalanx adjacent to the second toe. Electronically Signed   By: Elsie Stain M.D.   On: 12/15/2017 12:36    ____________________________________________   PROCEDURES  Procedure(s) performed: None  Procedures  Critical Care performed: No  ____________________________________________   INITIAL IMPRESSION / ASSESSMENT AND PLAN / ED COURSE  As part of my medical decision making, I reviewed the following data within the electronic MEDICAL RECORD NUMBER    Right great toe pain secondary to nondisplaced fracture.  Discussed x-ray findings with patient.  Patient toe was buddy taped.  Patient given postop shoe.  Patient advised to ambulate with crutches for 5-7 days as needed.  Patient given work note.  Take medication as directed.  Advised to follow-up with podiatry if complaints worsens.      ____________________________________________   FINAL CLINICAL IMPRESSION(S) / ED DIAGNOSES  Final diagnoses:  Nondisplaced fracture of distal phalanx of  left great toe, initial encounter for closed fracture     ED Discharge Orders        Ordered    naproxen (NAPROSYN) 500 MG tablet  2 times daily with meals     12/15/17 1248    traMADol (ULTRAM) 50 MG tablet  Every 6 hours PRN     12/15/17 1248       Note:  This document was prepared using Dragon voice recognition software and may include unintentional dictation errors.    Joni Reining, PA-C 12/15/17 1252    Loleta Rose, MD 12/15/17 1320

## 2018-08-06 ENCOUNTER — Telehealth: Payer: Self-pay | Admitting: Obstetrics and Gynecology

## 2018-08-06 NOTE — Telephone Encounter (Signed)
Patient is schedule for annual and nexplanon removal and reinsertion with ABC on 08/10/18

## 2018-08-07 NOTE — Telephone Encounter (Signed)
Nexplanon reserved for this patient. 

## 2018-08-10 ENCOUNTER — Other Ambulatory Visit (HOSPITAL_COMMUNITY)
Admission: RE | Admit: 2018-08-10 | Discharge: 2018-08-10 | Disposition: A | Discharge status: Home / Self Care | Payer: BLUE CROSS/BLUE SHIELD | Source: Ambulatory Visit | Attending: Obstetrics and Gynecology | Admitting: Obstetrics and Gynecology

## 2018-08-10 ENCOUNTER — Encounter: Payer: Self-pay | Admitting: Obstetrics and Gynecology

## 2018-08-10 ENCOUNTER — Ambulatory Visit (INDEPENDENT_AMBULATORY_CARE_PROVIDER_SITE_OTHER): Payer: BLUE CROSS/BLUE SHIELD | Admitting: Obstetrics and Gynecology

## 2018-08-10 VITALS — BP 112/64 | HR 90 | Ht 69.0 in | Wt 188.0 lb

## 2018-08-10 DIAGNOSIS — Z113 Encounter for screening for infections with a predominantly sexual mode of transmission: Secondary | ICD-10-CM | POA: Insufficient documentation

## 2018-08-10 DIAGNOSIS — Z01419 Encounter for gynecological examination (general) (routine) without abnormal findings: Secondary | ICD-10-CM | POA: Diagnosis not present

## 2018-08-10 DIAGNOSIS — Z3046 Encounter for surveillance of implantable subdermal contraceptive: Secondary | ICD-10-CM

## 2018-08-10 DIAGNOSIS — Z124 Encounter for screening for malignant neoplasm of cervix: Secondary | ICD-10-CM

## 2018-08-10 DIAGNOSIS — Z30017 Encounter for initial prescription of implantable subdermal contraceptive: Secondary | ICD-10-CM

## 2018-08-10 DIAGNOSIS — Z3049 Encounter for surveillance of other contraceptives: Secondary | ICD-10-CM

## 2018-08-10 MED ORDER — ETONOGESTREL 68 MG ~~LOC~~ IMPL: 68.0000 mg | DRUG_IMPLANT | Freq: Once | SUBCUTANEOUS | Status: DC

## 2018-08-10 NOTE — Progress Notes (Signed)
PCP:  Patient, No Pcp Per   Chief Complaint  Patient presents with  . Contraception    nexplanon removal/reinsertion  . Gynecologic Exam     HPI:      Ms. Virginia Doyle is a 27 y.o. [id number] who LMP was Patient's last menstrual period was 07/27/2018 (approximate)., presents today for her annual examination.  Her menses are regular every 28-30 days, lasting 10 days.  Dysmenorrhea mild, occurring first 1-2 days of flow. She does have occas intermenstrual bleeding.  Sex activity: single partner, contraception -Nexplanon placed 08/11/15. Wants another one.  Last Pap: July 25, 2015  Results were: no abnormalities  Hx of STDs: none  There is a FH of breast cancer in her MGM, genetic testing not indicated. There is no FH of ovarian cancer. The patient does do self-breast exams.  Tobacco use: 3 cigs daily, trying to quit Alcohol use: none No drug use.  Exercise: moderately active  She does get adequate calcium and Vitamin D in her diet.   Past Medical History:  Diagnosis Date  . Anemia affecting pregnancy   . Obesity     Past Surgical History:  Procedure Laterality Date  . ARTHROSCOPIC REPAIR ACL Right   . SKIN GRAFT Left    pinkie    Family History  Problem Relation Age of Onset  . Diabetes Father        TYPE 2  . Cancer Father        LYMPHOMA  . Breast cancer Maternal Grandmother 60  . Diabetes Paternal Grandfather        TYPE 2    Social History   Socioeconomic History  . Marital status: Single    Spouse name: Not on file  . Number of children: 2  . Years of education: 3912  . Highest education level: Not on file  Occupational History  . Not on file  Social Needs  . Financial resource strain: Not on file  . Food insecurity:    Worry: Not on file    Inability: Not on file  . Transportation needs:    Medical: Not on file    Non-medical: Not on file  Tobacco Use  . Smoking status: Former Games developermoker  . Smokeless tobacco: Never Used  Substance and  Sexual Activity  . Alcohol use: No    Alcohol/week: 0.0 standard drinks  . Drug use: No  . Sexual activity: Yes    Birth control/protection: Inserts    Comment: Nexplanon  Lifestyle  . Physical activity:    Days per week: Not on file    Minutes per session: Not on file  . Stress: Not on file  Relationships  . Social connections:    Talks on phone: Not on file    Gets together: Not on file    Attends religious service: Not on file    Active member of club or organization: Not on file    Attends meetings of clubs or organizations: Not on file    Relationship status: Not on file  . Intimate partner violence:    Fear of current or ex partner: Not on file    Emotionally abused: Not on file    Physically abused: Not on file    Forced sexual activity: Not on file  Other Topics Concern  . Not on file  Social History Narrative  . Not on file    Outpatient Medications Prior to Visit  Medication Sig Dispense Refill  . ferrous sulfate (FERROUSUL) 325 (  65 FE) MG tablet Take 1 tablet (325 mg total) by mouth 2 (two) times daily. 60 tablet 1  . ibuprofen (ADVIL,MOTRIN) 600 MG tablet Take 1 tablet (600 mg total) by mouth every 6 (six) hours. 30 tablet 0  . naproxen (NAPROSYN) 500 MG tablet Take 1 tablet (500 mg total) by mouth 2 (two) times daily with a meal. 20 tablet 0  . Prenatal Vit-Fe Fumarate-FA (PRENATAL MULTIVITAMIN) TABS tablet Take 1 tablet by mouth daily at 12 noon.    . traMADol (ULTRAM) 50 MG tablet Take 1 tablet (50 mg total) by mouth every 6 (six) hours as needed. 20 tablet 0   No facility-administered medications prior to visit.       ROS:  Review of Systems  Constitutional: Negative for fatigue, fever and unexpected weight change.  Respiratory: Negative for cough, shortness of breath and wheezing.   Cardiovascular: Negative for chest pain, palpitations and leg swelling.  Gastrointestinal: Negative for blood in stool, constipation, diarrhea, nausea and vomiting.    Endocrine: Negative for cold intolerance, heat intolerance and polyuria.  Genitourinary: Positive for vaginal bleeding. Negative for dyspareunia, dysuria, flank pain, frequency, genital sores, hematuria, menstrual problem, pelvic pain, urgency, vaginal discharge and vaginal pain.  Musculoskeletal: Negative for back pain, joint swelling and myalgias.  Skin: Negative for rash.  Neurological: Negative for dizziness, syncope, light-headedness, numbness and headaches.  Hematological: Negative for adenopathy.  Psychiatric/Behavioral: Negative for agitation, confusion, sleep disturbance and suicidal ideas. The patient is not nervous/anxious.   BREAST: No symptoms   Objective: BP 112/64   Pulse 90   Ht 5\' 9"  (1.753 m)   Wt 188 lb (85.3 kg)   LMP 07/27/2018 (Approximate)   Breastfeeding? No   BMI 27.76 kg/m    Physical Exam  Constitutional: She is oriented to person, place, and time. She appears well-developed and well-nourished.  Genitourinary: Vagina normal and uterus normal. There is no rash or tenderness on the right labia. There is no rash or tenderness on the left labia. No erythema or tenderness in the vagina. No vaginal discharge found. Right adnexum does not display mass and does not display tenderness. Left adnexum does not display mass and does not display tenderness. Cervix does not exhibit motion tenderness or polyp. Uterus is not enlarged or tender.  Neck: Normal range of motion. No thyromegaly present.  Cardiovascular: Normal rate, regular rhythm and normal heart sounds.  No murmur heard. Pulmonary/Chest: Effort normal and breath sounds normal. Right breast exhibits no mass, no nipple discharge, no skin change and no tenderness. Left breast exhibits no mass, no nipple discharge, no skin change and no tenderness.  Abdominal: Soft. There is no tenderness. There is no guarding.  Musculoskeletal: Normal range of motion.  Neurological: She is alert and oriented to person, place, and  time. No cranial nerve deficit.  Psychiatric: She has a normal mood and affect. Her behavior is normal.  Vitals reviewed.   Assessment/Plan: Encounter for annual routine gynecological examination  Cervical cancer screening - Plan: Cytology - PAP  Screening for STD (sexually transmitted disease) - Plan: Cytology - PAP  Nexplanon removal  Nexplanon insertion - Plan: etonogestrel (NEXPLANON) implant 68 mg  Meds ordered this encounter  Medications  . etonogestrel (NEXPLANON) implant 68 mg             GYN counsel adequate intake of calcium and vitamin D, diet and exercise, tobacco cessation   Nexplanon removal Procedure note - The Nexplanon was noted in the patient's arm and the  end was identified. The skin was cleansed with a Betadine solution. A small injection of subcutaneous lidocaine with epinephrine was given over the end of the implant. An incision was made at the end of the implant. The rod was noted in the incision and grasped with a hemostat. It was noted to be intact.  Steri-Strip was placed approximating the incision. Hemostasis was noted.   Nexplanon Insertion  Patient given informed consent, signed copy in the chart, time out was performed.  Appropriate time out taken.  Patient's LEFT arm was prepped and draped in the usual sterile fashion. The ruler used to measure and mark insertion area.  Pt was prepped with betadine swab and then injected with 1.0 cc of 2% lidocaine with epinephrine. Nexplanon removed form packaging,  Device confirmed in needle, then inserted full length of needle and withdrawn per handbook instructions.  Pt insertion site covered with steri-strip and a bandage.   Minimal blood loss.  Pt tolerated the procedure welL.   Plan:   She was told to remove the dressing in 12-24 hours, to keep the incision area dry for 24 hours and to remove the Steristrip in 2-3  days.  Notify us if any signs of tenderness, redness, pain, or fevers develop.  Return in  about 1 year (around 08/11/2019).   Helmut Muster Julianah Marciel, PA-C 08/10/2018

## 2018-08-10 NOTE — Patient Instructions (Addendum)
I value your feedback and entrusting us with your care. If you get a [facility] patient survey, I would appreciate you taking the time to let us know about your experience today. Thank you!  Remove the dressing in 24 hours,  keep the incision area dry for 24 hours and remove the Steristrip in 2-3  days.  Notify us if any signs of tenderness, redness, pain, or fevers develop.  

## 2018-08-11 LAB — CYTOLOGY - PAP
CHLAMYDIA, DNA PROBE: NEGATIVE
Diagnosis: NEGATIVE
Neisseria Gonorrhea: NEGATIVE
Trichomonas: NEGATIVE

## 2019-12-01 ENCOUNTER — Other Ambulatory Visit: Payer: Self-pay

## 2019-12-01 ENCOUNTER — Emergency Department: Payer: Self-pay

## 2019-12-01 ENCOUNTER — Encounter: Payer: Self-pay | Admitting: Emergency Medicine

## 2019-12-01 ENCOUNTER — Emergency Department
Admission: EM | Admit: 2019-12-01 | Discharge: 2019-12-01 | Disposition: A | Discharge status: Home / Self Care | Payer: Self-pay | Attending: Emergency Medicine | Admitting: Emergency Medicine

## 2019-12-01 DIAGNOSIS — Z87891 Personal history of nicotine dependence: Secondary | ICD-10-CM | POA: Insufficient documentation

## 2019-12-01 DIAGNOSIS — M79672 Pain in left foot: Secondary | ICD-10-CM | POA: Insufficient documentation

## 2019-12-01 MED ORDER — LIDOCAINE 5 % EX PTCH: 1.0000 | MEDICATED_PATCH | Freq: Two times a day (BID) | CUTANEOUS | 0 refills | Status: DC

## 2019-12-01 MED ORDER — LIDOCAINE 5 % EX PTCH: 1.0000 | MEDICATED_PATCH | Freq: Two times a day (BID) | CUTANEOUS | 0 refills | Status: AC

## 2019-12-01 MED ORDER — NAPROXEN 500 MG PO TABS: 500.0000 mg | ORAL_TABLET | Freq: Two times a day (BID) | ORAL | Status: DC

## 2019-12-01 MED ORDER — LIDOCAINE 5 % EX PTCH
1.0000 | MEDICATED_PATCH | CUTANEOUS | Status: DC
  Administered 2019-12-01: 1 via TRANSDERMAL
  Filled 2019-12-01: qty 1

## 2019-12-01 NOTE — Discharge Instructions (Signed)
No acute findings for your foot pain.  Advised to follow-up with podiatry for definitive evaluation and treatment.

## 2019-12-01 NOTE — ED Triage Notes (Signed)
Pt reports pain to her left foot for the past 2 weeks. Pt unsure of injuries.

## 2019-12-01 NOTE — ED Notes (Signed)
Pt is in the overflow room , not room 40

## 2019-12-01 NOTE — ED Provider Notes (Signed)
[facility] Emergency Department Provider Note   ____________________________________________   None    (approximate)  I have reviewed the triage vital signs and the nursing notes.   HISTORY  Chief Complaint Foot Pain    HPI Virginia Doyle is a 29 y.o. female patient complains of 2 weeks of increasing left lateral foot pain.  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  No other provocative measures for complaint.  Patient stated no relief taking anti-inflammatory medication.  Patient states pain increased last night that she was almost crying.  Patient describes the pain as "achy".  Patient rates the pain as 7/10.  No other palliative measure for complaint.         Past Medical History:  Diagnosis Date  . Anemia affecting pregnancy   . Obesity     Patient Active Problem List   Diagnosis Date Noted  . Postpartum care following vaginal delivery 06/14/2015    Past Surgical History:  Procedure Laterality Date  . ARTHROSCOPIC REPAIR ACL Right   . SKIN GRAFT Left    pinkie    Prior to Admission medications   Medication Sig Start Date End Date Taking? Authorizing Provider  lidocaine (LIDODERM) 5 % Place 1 patch onto the skin every 12 (twelve) hours. Remove & Discard patch within 12 hours or as directed by MD 12/01/19 11/30/20  Sable Feil, PA-C  naproxen (NAPROSYN) 500 MG tablet Take 1 tablet (500 mg total) by mouth 2 (two) times daily with a meal. 12/01/19   Sable Feil, PA-C    Allergies Patient has no known allergies.  Family History  Problem Relation Age of Onset  . Diabetes Father        TYPE 2  . Cancer Father        LYMPHOMA  . Breast cancer Maternal Grandmother 60  . Diabetes Paternal Grandfather        TYPE 2    Social History Social History   Tobacco Use  . Smoking status: Former Research scientist (life sciences)  . Smokeless tobacco: Never Used  Substance Use Topics  . Alcohol use: No   Alcohol/week: 0.0 standard drinks  . Drug use: No    Review of Systems Constitutional: No fever/chills Eyes: No visual changes. ENT: No sore throat. Cardiovascular: Denies chest pain. Respiratory: Denies shortness of breath. Gastrointestinal: No abdominal pain.  No nausea, no vomiting.  No diarrhea.  No constipation. Genitourinary: Negative for dysuria. Musculoskeletal: Left foot pain. Skin: Negative for rash. Neurological: Negative for headaches, focal weakness or numbness.   ____________________________________________   PHYSICAL EXAM:  VITAL SIGNS: ED Triage Vitals  Enc Vitals Group     BP 12/01/19 1233 125/65     Pulse Rate 12/01/19 1233 86     Resp 12/01/19 1233 18     Temp 12/01/19 1233 98.5 F (36.9 C)     Temp Source 12/01/19 1233 Oral     SpO2 12/01/19 1233 100 %     Weight 12/01/19 1157 188 lb (85.3 kg)     Height 12/01/19 1157 5\' 8"  (1.727 m)     Head Circumference --      Peak Flow --      Pain Score 12/01/19 1157 7     Pain Loc --      Pain Edu? --      Excl. in [place]? --    Constitutional: Alert and oriented. Well appearing and in no  acute distress. Cardiovascular: Normal rate, regular rhythm. Grossly normal heart sounds.  Good peripheral circulation. Respiratory: Normal respiratory effort.  No retractions. Lungs CTAB. Musculoskeletal: Mild pes planus of the left foot.  Patient has moderate guarding palpation fifth lateral metatarsal.   Neurologic:  Normal speech and language. No gross focal neurologic deficits are appreciated. No gait instability. Skin:  Skin is warm, dry and intact. No rash noted.  No abrasions or ecchymosis. Psychiatric: Mood and affect are normal. Speech and behavior are normal.  ____________________________________________   LABS (all labs ordered are listed, but only abnormal results are displayed)  Labs Reviewed - No data to display ____________________________________________  EKG    ____________________________________________  RADIOLOGY  ED MD interpretation:    Official radiology report(s): DG Foot Complete Left  Result Date: 12/01/2019 CLINICAL DATA:  Two weeks of increasing lateral foot pain, no injury. EXAM: LEFT FOOT - COMPLETE 3+ VIEW COMPARISON:  None. FINDINGS: No acute osseous or joint abnormality. IMPRESSION: No findings to explain the patient's pain. Electronically Signed   By: Leanna Battles M.D.   On: 12/01/2019 13:39    ____________________________________________   PROCEDURES  Procedure(s) performed (including Critical Care):  Procedures   ____________________________________________   INITIAL IMPRESSION / ASSESSMENT AND PLAN / ED COURSE  As part of my medical decision making, I reviewed the following data within the electronic MEDICAL RECORD NUMBER     Patient presents with increasing left foot pain with no provocative incident except for working.  Discussed negative x-ray findings with patient.  Patient has mild pes planus.  Patient given discharge care instruction consult to podiatry for definitive evaluation and treatment.    Virginia Doyle was evaluated in Emergency Department on 12/01/2019 for the symptoms described in the history of present illness. She was evaluated in the context of the global COVID-19 pandemic, which necessitated consideration that the patient might be at risk for infection with the SARS-CoV-2 virus that causes COVID-19. Institutional protocols and algorithms that pertain to the evaluation of patients at risk for COVID-19 are in a state of rapid change based on information released by regulatory bodies including the CDC and federal and state organizations. These policies and algorithms were followed during the patient's care in the ED.       ____________________________________________   FINAL CLINICAL IMPRESSION(S) / ED DIAGNOSES  Final diagnoses:  Foot pain, left     ED Discharge Orders         Ordered     lidocaine (LIDODERM) 5 %  Every 12 hours,   Status:  Discontinued     12/01/19 1405    naproxen (NAPROSYN) 500 MG tablet  2 times daily with meals,   Status:  Discontinued     12/01/19 1405    lidocaine (LIDODERM) 5 %  Every 12 hours     12/01/19 1408    naproxen (NAPROSYN) 500 MG tablet  2 times daily with meals     12/01/19 1408           Note:  This document was prepared using Dragon voice recognition software and may include unintentional dictation errors.    Joni Reining, PA-C 12/01/19 1411    Dionne Bucy, MD 12/01/19 [phone]

## 2020-01-04 NOTE — Telephone Encounter (Signed)
Nexplanon rcvd/charged 08/10/18

## 2020-03-05 NOTE — Progress Notes (Deleted)
   No chief complaint on file.    History of Present Illness:  Virginia Doyle is a 29 y.o. that had a nexplanon placed approximately 1 1/2 years  ago. Since that time, she  ***.  There were no vitals taken for this visit.   Nexplanon removal Procedure note - The Nexplanon was noted in the patient's arm and the end was identified. The skin was cleansed with a Betadine solution. A small injection of subcutaneous lidocaine with epinephrine was given over the end of the implant. An incision was made at the end of the implant. The rod was noted in the incision and grasped with a hemostat. It was noted to be intact.  Steri-Strip was placed approximating the incision. Hemostasis was noted.  Assessment: Nexplanon removal   Plan:   She was told to remove the dressing in 12-24 hours, to keep the incision area dry for 24 hours and to remove the Steristrip in 2-3  days.  Notify us if any signs of tenderness, redness, pain, or fevers develop.   Lisel Siegrist B. Huey Scalia, PA-C 03/05/2020 6:25 PM

## 2020-03-06 ENCOUNTER — Ambulatory Visit: Payer: Self-pay | Admitting: Obstetrics and Gynecology

## 2020-12-13 ENCOUNTER — Encounter: Payer: Self-pay | Admitting: Advanced Practice Midwife

## 2020-12-13 ENCOUNTER — Ambulatory Visit: Payer: Medicaid Other | Admitting: Advanced Practice Midwife

## 2020-12-13 ENCOUNTER — Other Ambulatory Visit: Payer: Self-pay

## 2020-12-13 DIAGNOSIS — Z113 Encounter for screening for infections with a predominantly sexual mode of transmission: Secondary | ICD-10-CM

## 2020-12-13 DIAGNOSIS — E663 Overweight: Secondary | ICD-10-CM

## 2020-12-13 DIAGNOSIS — A599 Trichomoniasis, unspecified: Secondary | ICD-10-CM | POA: Insufficient documentation

## 2020-12-13 HISTORY — DX: Trichomoniasis, unspecified: A59.9

## 2020-12-13 HISTORY — DX: Overweight: E66.3

## 2020-12-13 MED ORDER — METRONIDAZOLE 500 MG PO TABS: 500.0000 mg | ORAL_TABLET | Freq: Two times a day (BID) | ORAL | 0 refills | Status: AC

## 2020-12-13 NOTE — Progress Notes (Signed)
Patient treated for Trich per SO. Treatment discussed with provider as patient is pregnant and per provider, ok to treat per SO.Marland KitchenBurt Knack, RN

## 2020-12-13 NOTE — Progress Notes (Signed)
  [facility] Department STI clinic/screening visit  Subjective:  Virginia Doyle is a 30 y.o. SBF G3P2 exsmoker female being seen today for an STI screening visit; only wants treatment for trich as contact; FOB was treated yesterday at [facility] for trich because they only waited 4 days after both were treated and had sex. The patient reports they do not have symptoms.  Patient reports that they do desire a pregnancy in the next year.   They reported they are not interested in discussing contraception today.  Patient's last menstrual period was 07/12/2020 (exact date).Pt is currently [redacted] wks pregnant and receiving prenatal care in [place] with next apt there 12/28/20.   Patient has the following medical conditions:   Patient Active Problem List   Diagnosis Date Noted  . Overweight 12/13/2020  . Trichomoniasis 12/13/20 12/13/2020  . Postpartum care following vaginal delivery 06/14/2015    Chief Complaint  Patient presents with  . Exposure to STD    Needs treatment for Trich.    HPI  Patient reports contact to trich and partner was treated here yesterday.  Pt refuses any screening and just wants tx for trich.  Last HIV test per patient/review of record was unknown Patient reports last pap was 10/2020 neg per pt and 08/10/18 neg in Epic  See flowsheet for further details and programmatic requirements.    The following portions of the patient's history were reviewed and updated as appropriate: allergies, current medications, past medical history, past social history, past surgical history and problem list.  Objective:  There were no vitals filed for this visit.  Physical Exam Pt refuses  Assessment and Plan:  Virginia Doyle is a 30 y.o. female presenting to the [facility] Department for STI screening  1. Screening examination for venereal disease Pt refuses screening today and just wants tx  2. Overweight   3. Trichomoniasis 12/13/20 Pt asking for  tx as contact to trich.  Treat per standing orders as trich contact     Return for PRN.  No future appointments.  Alberteen Spindle, CNM

## 2020-12-13 NOTE — Progress Notes (Signed)
Patient here for STD treatment. Partner treated for Trich yesterday and she seeks treatment for same. Patient is [redacted] weeks pregnant and receiving prenatal care in [place].Burt Knack, RN

## 2021-07-20 IMAGING — DX DG FOOT COMPLETE 3+V*L*
3 series · 3 of 3 positions shown · non-contrast
Comparison: None.

CLINICAL DATA: Two weeks of increasing lateral foot pain, no
injury.

EXAM:
LEFT FOOT - COMPLETE 3+ VIEW

[foot ap]
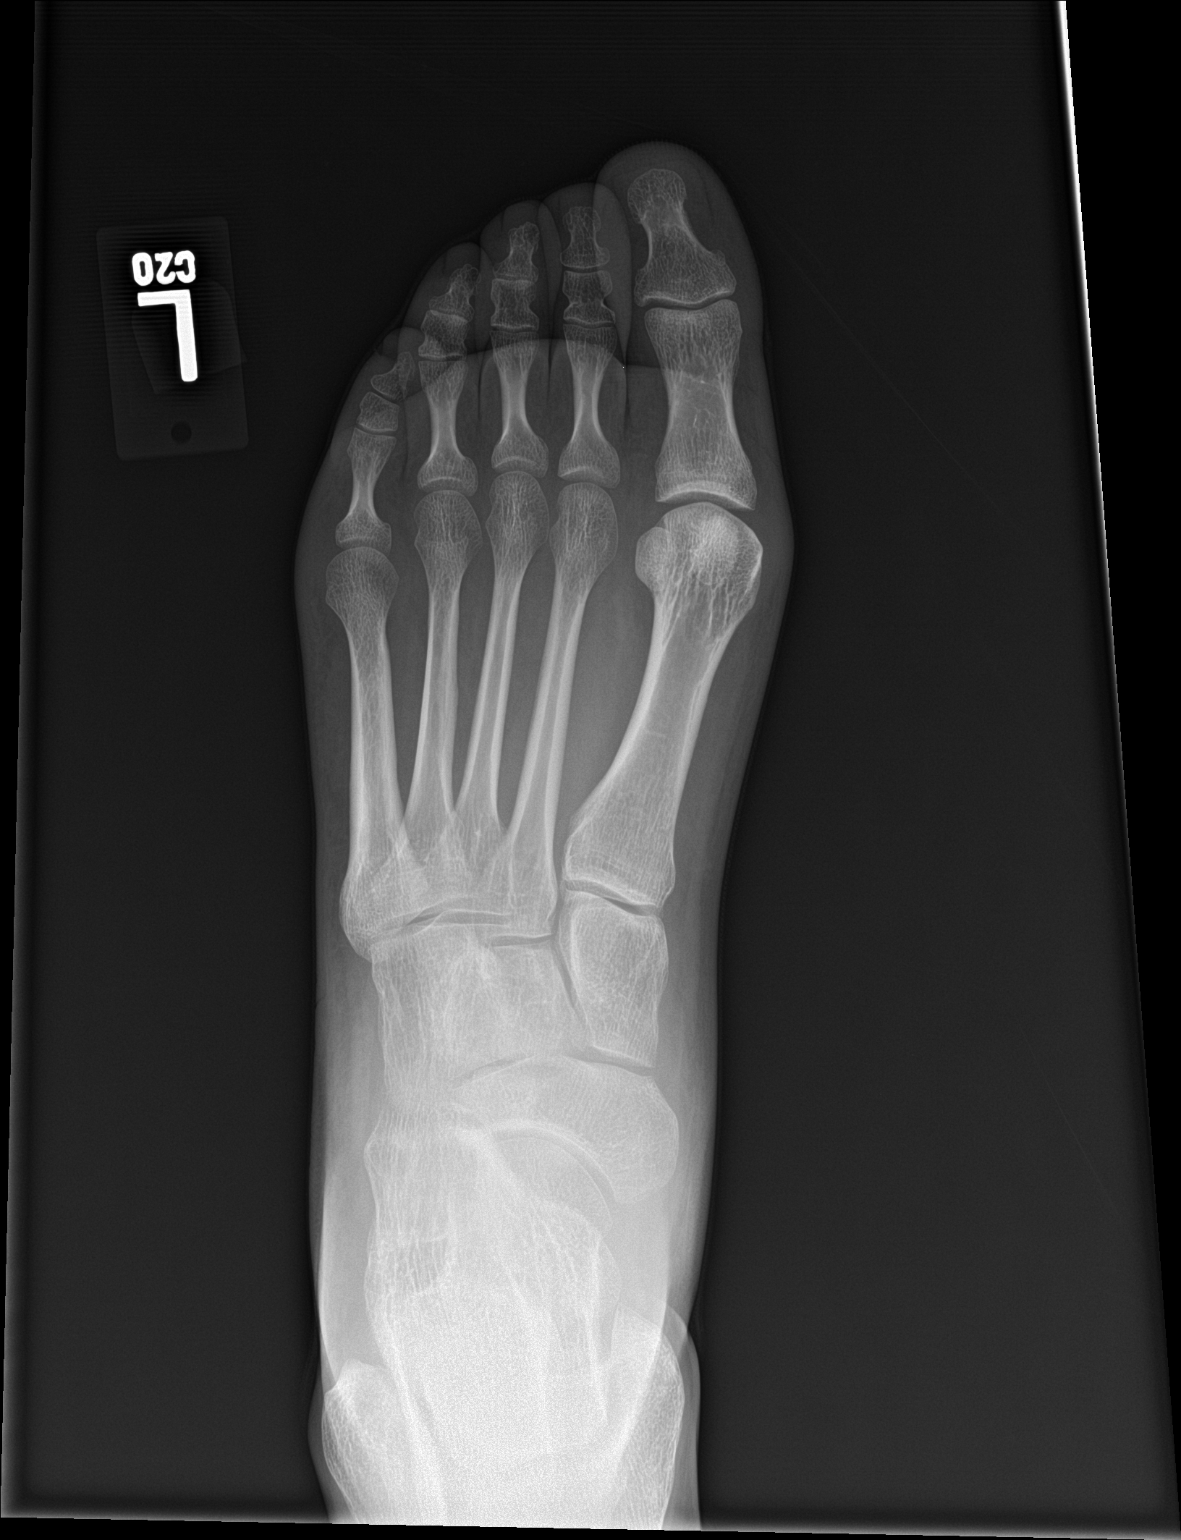

[foot obl]
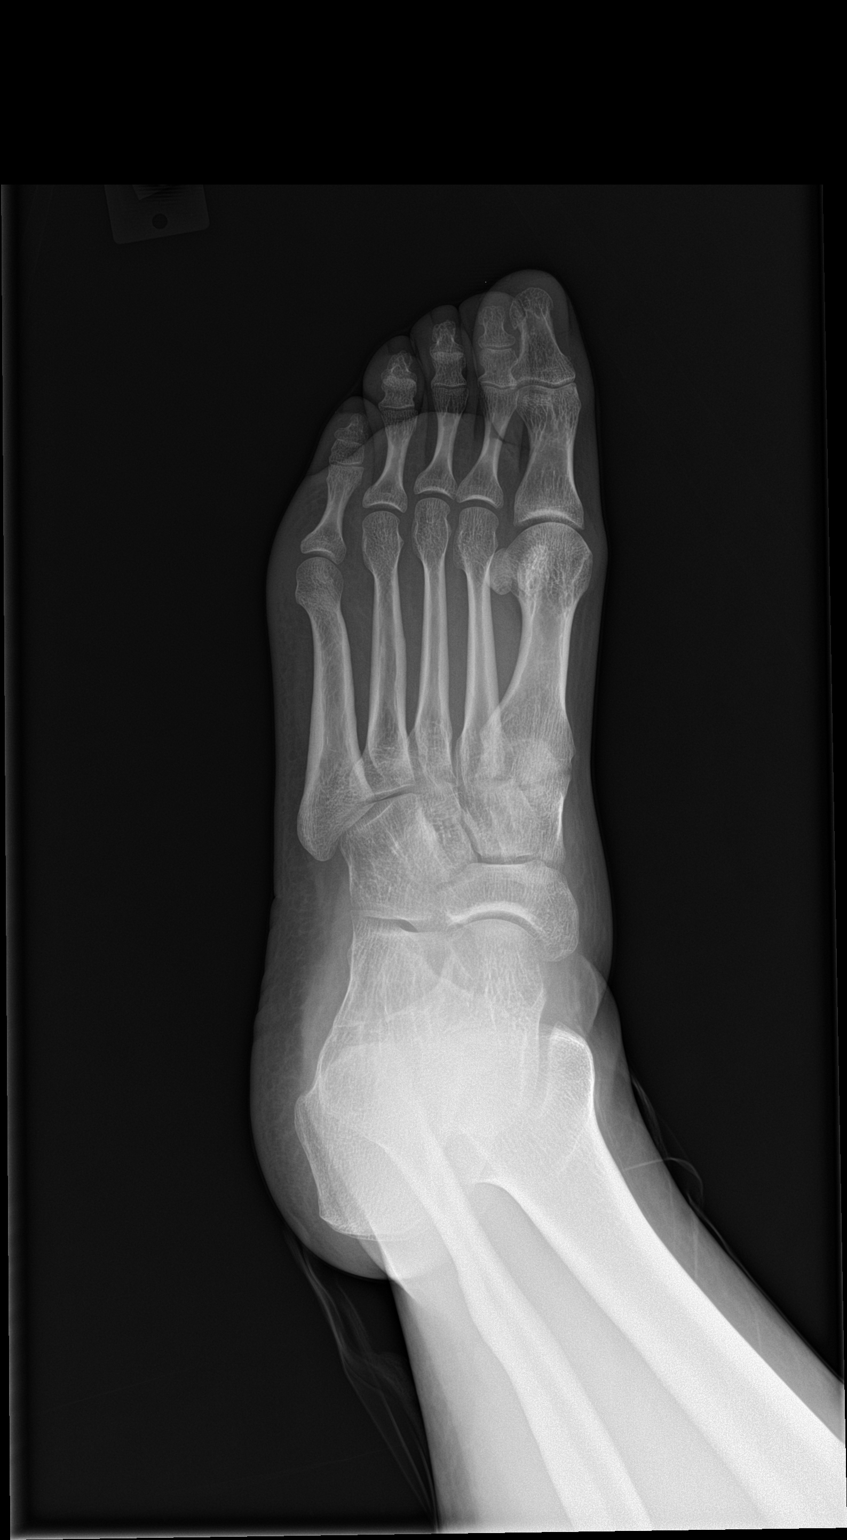

[foot lat]
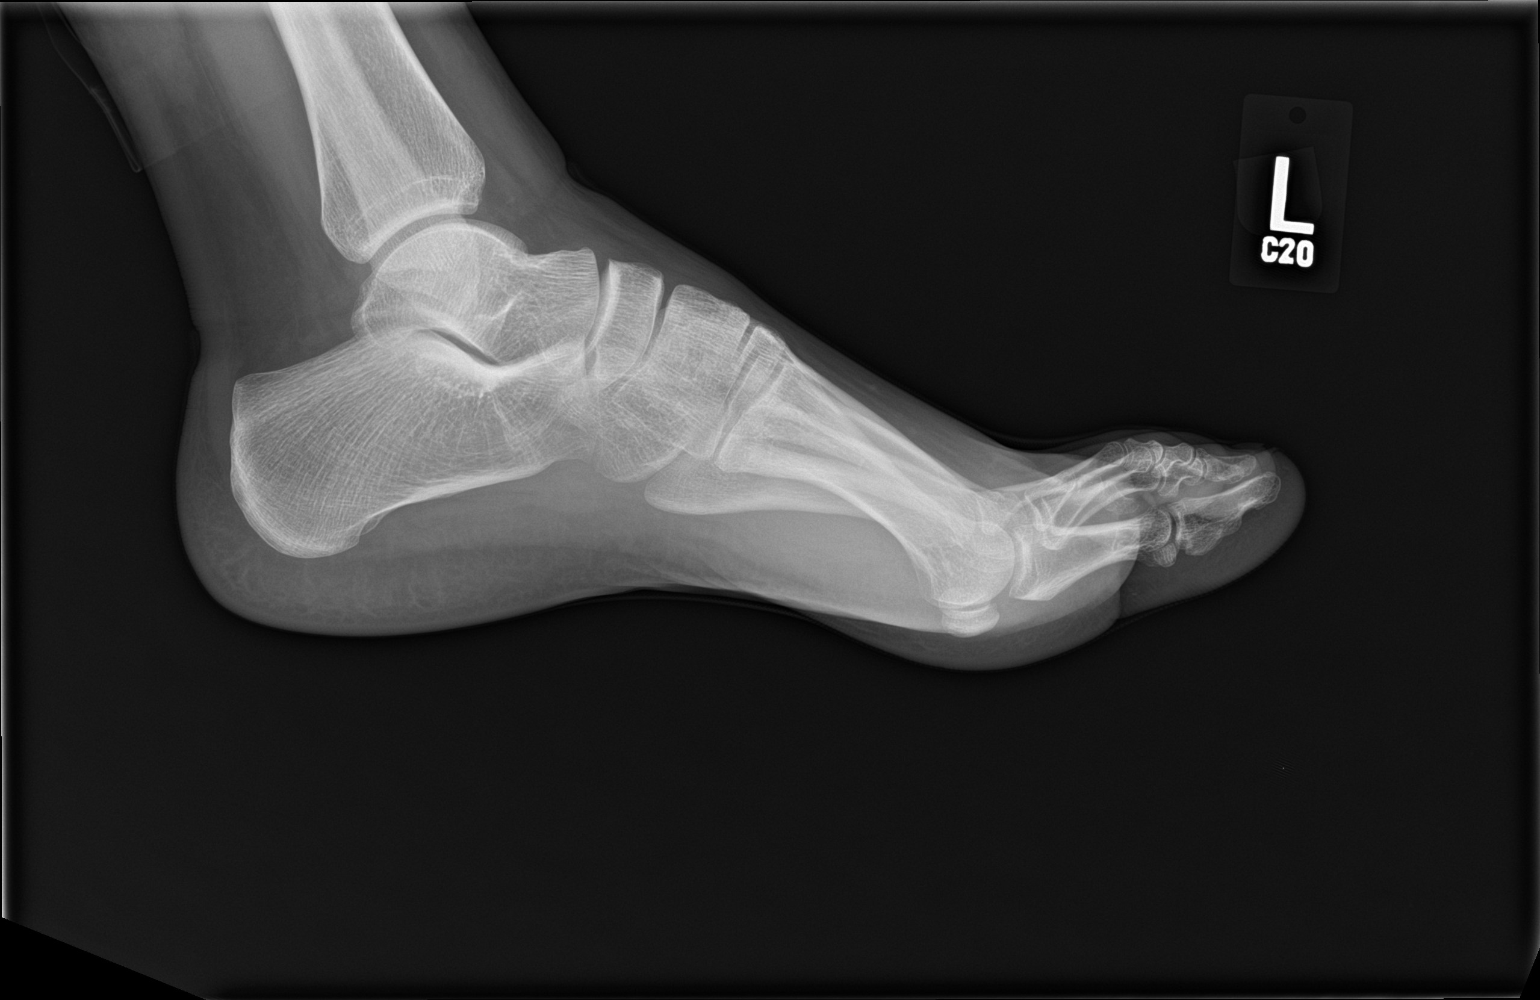

[3 of 3 positions shown; findings below may reference images not displayed]

FINDINGS: No acute osseous or joint abnormality.
IMPRESSION: No findings to explain the patient's pain.

## 2021-08-24 ENCOUNTER — Other Ambulatory Visit: Payer: Self-pay

## 2021-08-24 ENCOUNTER — Encounter: Payer: Self-pay | Admitting: Advanced Practice Midwife

## 2021-08-24 ENCOUNTER — Ambulatory Visit: Payer: Medicaid Other | Admitting: Advanced Practice Midwife

## 2021-08-24 DIAGNOSIS — Z113 Encounter for screening for infections with a predominantly sexual mode of transmission: Secondary | ICD-10-CM

## 2021-08-24 LAB — WET PREP FOR TRICH, YEAST, CLUE
Trichomonas Exam: NEGATIVE
Yeast Exam: NEGATIVE

## 2021-08-24 NOTE — Progress Notes (Signed)
Pt here for STD screening.  Wet mount results reviewed, no treatment required.  Jovonda Selner M Cierah Crader, RN ? ?

## 2021-08-24 NOTE — Progress Notes (Signed)
[facility] Department STI clinic/screening visit  Subjective:  Virginia Doyle is a 30 y.o. SBF exsmoker G3P3 female being seen today for an STI screening visit. The patient reports they do have symptoms.  Patient reports that they do not desire a pregnancy in the next year.   They reported they are not interested in discussing contraception today.  No LMP recorded. (Menstrual status: Oral contraceptives).   Patient has the following medical conditions:   Patient Active Problem List   Diagnosis Date Noted   Morbid obesity (HCC) 188 lbs 08/24/2021   Overweight 12/13/2020   Trichomoniasis 12/13/20 12/13/2020   Postpartum care following vaginal delivery 06/14/2015    Chief Complaint  Patient presents with   SEXUALLY TRANSMITTED DISEASE    Screening    HPI  Patient reports "little tingle" externally x 1 week. Last sex 08/22/21 without condom; with current partner x 9 years; 1 sex partner in last 3 mo. Last MJ 07/2020. Last ETOH 08/04/21 (2 wine coolers) 2x/mo.   Last HIV test per patient/review of record was unknown Patient reports last pap was 08/10/18 neg   See flowsheet for further details and programmatic requirements.    The following portions of the patient's history were reviewed and updated as appropriate: allergies, current medications, past medical history, past social history, past surgical history and problem list.  Objective:  There were no vitals filed for this visit.  Physical Exam Vitals and nursing note reviewed.  Constitutional:      Appearance: Normal appearance. She is obese.  HENT:     Head: Normocephalic and atraumatic.     Mouth/Throat:     Mouth: Mucous membranes are moist.     Pharynx: Oropharynx is clear. No oropharyngeal exudate or posterior oropharyngeal erythema.  Eyes:     Conjunctiva/sclera: Conjunctivae normal.  Pulmonary:     Effort: Pulmonary effort is normal.  Abdominal:     Palpations: Abdomen is soft. There is no mass.      Tenderness: There is no abdominal tenderness. There is no rebound.     Comments: Soft without masses or tenderness  Genitourinary:    General: Normal vulva.     Exam position: Lithotomy position.     Pubic Area: No rash or pubic lice.      Labia:        Right: No rash or lesion.        Left: No rash or lesion.      Vagina: Vaginal discharge (grey malodorous leukorrhea, ph>4.5) present. No erythema, bleeding or lesions.     Cervix: Normal.     Uterus: Normal.      Adnexa: Right adnexa normal and left adnexa normal.     Rectum: Normal.  Lymphadenopathy:     Head:     Right side of head: No preauricular or posterior auricular adenopathy.     Left side of head: No preauricular or posterior auricular adenopathy.     Cervical: No cervical adenopathy.     Right cervical: No superficial, deep or posterior cervical adenopathy.    Left cervical: No superficial, deep or posterior cervical adenopathy.     Upper Body:     Right upper body: No supraclavicular or axillary adenopathy.     Left upper body: No supraclavicular or axillary adenopathy.     Lower Body: No right inguinal adenopathy. No left inguinal adenopathy.  Skin:    General: Skin is warm and dry.     Findings: No rash.  Neurological:  Mental Status: She is alert and oriented to person, place, and time.     Assessment and Plan:  Virginia Doyle is a 30 y.o. female presenting to the [facility] Department for STI screening  1. Morbid obesity (HCC) 188 lbs   2. Screening examination for venereal disease Treat wet mount per standing orders Immunization nurse consult - WET PREP FOR TRICH, YEAST, CLUE - Syphilis Serology, [facility] - Chlamydia/Gonorrhea [place] [facility] - HIV [place] [facility]     No follow-ups on file.  No future appointments.  Alberteen Spindle, CNM

## 2023-05-28 LAB — PANORAMA PRENATAL TEST FULL PANEL:PANORAMA TEST PLUS 5 ADDITIONAL MICRODELETIONS: FETAL FRACTION: 7.7

## 2024-05-18 ENCOUNTER — Ambulatory Visit (LOCAL_COMMUNITY_HEALTH_CENTER)

## 2024-05-18 ENCOUNTER — Other Ambulatory Visit: Payer: Self-pay

## 2024-05-18 DIAGNOSIS — Z111 Encounter for screening for respiratory tuberculosis: Secondary | ICD-10-CM

## 2024-05-18 NOTE — Progress Notes (Signed)
 PPD Placement note Virginia Doyle, 33 y.o. 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 No:  Is patient taking any oral or IV steroid medication now or have they taken it in the last month? no Has the patient ever received the BCG vaccine?: no Has the patient been in recent contact with anyone known or suspected of having active TB disease?: no      Date of exposure (if applicable): NA      Name of person they were exposed to (if applicable): NA Patient's Country of origin?: [place]  O: Alert and oriented in NAD. P:  PPD placed on 05/18/2024 @ 9:10 am.  Patient scheduled to return for reading on 05/20/2024   Kandi KATHEE Glatter, RN

## 2024-05-20 ENCOUNTER — Ambulatory Visit

## 2024-05-20 DIAGNOSIS — Z111 Encounter for screening for respiratory tuberculosis: Secondary | ICD-10-CM

## 2024-05-20 LAB — TB SKIN TEST
Induration: 0 mm
TB Skin Test: NEGATIVE

## 2024-05-28 ENCOUNTER — Other Ambulatory Visit

## 2024-05-28 ENCOUNTER — Encounter: Payer: Self-pay | Admitting: Family Medicine

## 2024-05-28 ENCOUNTER — Ambulatory Visit: Admitting: Family Medicine

## 2024-05-28 DIAGNOSIS — Z2239 Carrier of other specified bacterial diseases: Secondary | ICD-10-CM | POA: Insufficient documentation

## 2024-05-28 DIAGNOSIS — Z113 Encounter for screening for infections with a predominantly sexual mode of transmission: Secondary | ICD-10-CM | POA: Diagnosis not present

## 2024-05-28 DIAGNOSIS — Z3490 Encounter for supervision of normal pregnancy, unspecified, unspecified trimester: Secondary | ICD-10-CM | POA: Insufficient documentation

## 2024-05-28 MED ORDER — AZITHROMYCIN 500 MG PO TABS
ORAL_TABLET | ORAL | Status: AC
Start: 2024-05-28 — End: 2024-06-01

## 2024-05-28 NOTE — Patient Instructions (Addendum)
 Ureaplasma I am glad you came in!  Today we gave you medicine to treat you as a contact to ureaplasma. Directions below:   Day 1 (Friday): Take 2 pills (total 1,000 mg) once Day 2 (Saturday): Take 1 pill (500 mg) two times per day Day 3 (Sunday): Take 1 pill (500 mg) two times per day Day 4 (Monday): Take 1 pill (500 mg) two times per day  Dispose of unused medication appropriately.

## 2024-05-28 NOTE — Assessment & Plan Note (Addendum)
 Will treat as asymptomatic contact to ureaplasma. Discussed with patient paucity of clear evidence on treatment regimen for pregnant contacts to ureaplasma. 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 Discussed that we do not currently offer ureaplasma testing here at [facility] and that we specifically do not have a way to test. All questions answered.

## 2024-05-28 NOTE — Progress Notes (Signed)
 [facility] Department STI clinic [address] [place] [address] Main phone: [phone]  STI screening visit  Subjective:  Virginia Doyle is a 33 y.o. female being seen today for an STI screening visit. The patient reports they do not have symptoms.  Patient reports that she is currently pregnant, around [redacted]w[redacted]d by irregular LMP.   Patient's last menstrual period was 04/18/2024 (exact date).  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   Virginia Doyle partner is asymptomatic. He was tested 7 days ago because his secondary sexual partner was symptomatic and found to be positive for ureaplasma.   Virginia Doyle endorses no symptoms. Denies vaginal discharge, odor, rash, lesion, fever, chills, dysuria, lower abdominal pain, and lymphadenopathy. Patient reports that she is currently pregnant, around [redacted]w[redacted]d by irregular LMP.   See flowsheet for further details and programmatic requirements Hyperlink available at the top of the signed note in blue.  Flow sheet content below:  Pregnancy Intention Screening Does the patient want to become pregnant in the next year?: Yes Does the patient's partner want to become pregnant in the next year?: Yes Would the patient like to discuss contraceptive options today?: N/A Counseling Patient counseled to use condoms with all sex: Condoms declined RTC in 2-3 weeks for test results: Yes Clinic will call if test results abnormal before test result appt.: Yes Test results given to patient Patient counseled to  use condoms with all sex: Condoms declined   Screening for MPX risk: Does the patient have an unexplained rash? No Is the patient MSM? No Does the patient endorse multiple sex partners or anonymous sex partners? No Did the patient have close or sexual contact with a person diagnosed with MPX? No Has the patient traveled outside the [place]  where MPX is endemic? No Is there a high clinical suspicion for MPX-- evidenced by one of the following No  -Unlikely to be chickenpox  -Lymphadenopathy  -Rash that present in same phase of evolution on any given body part  Screenings: Last HIV test per patient/review of record was No results found for: HMHIVSCREEN  Lab Results  Component Value Date   HIV Non-reactive 03/24/2015    Last HEPC test per patient/review of record was No results found for: HMHEPCSCREEN No components found for: HEPC   Last HEPB test per patient/review of record was No components found for: HMHEPBSCREEN   Patient reports last pap was:   No results found for: SPECADGYN Result Date Procedure Results Follow-ups  08/10/2018 Cytology - PAP Adequacy: Satisfactory for evaluation  endocervical/transformation zone component PRESENT. Diagnosis: NEGATIVE FOR INTRAEPITHELIAL LESIONS OR MALIGNANCY. Chlamydia: Negative Neisseria Gonorrhea: Negative Trichomonas: Negative Material Submitted: CervicoVaginal Pap [ThinPrep Imaged]   07/25/2015 HM PAP SMEAR HM Pap smear: NEG, CT/GC NEG    Immunization history:  Immunization History  Administered Date(s) Administered   PPD Test 05/18/2024    The following portions of the patient's history were reviewed and updated as appropriate: allergies, current medications, past medical history, past social history, past surgical history and problem list.  Objective:  There were no  vitals filed for this visit.  Physical Exam Vitals and nursing note reviewed.  Constitutional:      General: She is not in acute distress.    Appearance: Normal  appearance. She is normal weight. She is not toxic-appearing.  HENT:     Head: Normocephalic.     Mouth/Throat:     Mouth: Mucous membranes are moist.  Eyes:     General: No scleral icterus.       Right eye: No discharge.        Left eye: No discharge.     Conjunctiva/sclera: Conjunctivae normal.  Cardiovascular:     Rate and Rhythm: Normal rate.  Pulmonary:     Effort: Pulmonary effort is normal.  Genitourinary:    Comments: Declined genital exam- no symptoms Musculoskeletal:        General: Normal range of motion.     Cervical back: Neck supple. No rigidity or tenderness.  Lymphadenopathy:     Head:     Right side of head: No submandibular, preauricular or posterior auricular adenopathy.     Left side of head: No submandibular, preauricular or posterior auricular adenopathy.     Cervical: No cervical adenopathy.     Upper Body:     Right upper body: No supraclavicular adenopathy.     Left upper body: No supraclavicular adenopathy.  Skin:    General: Skin is warm and dry.     Capillary Refill: Capillary refill takes less than 2 seconds.     Coloration: Skin is not jaundiced or pale.     Findings: No bruising, erythema, lesion or rash.  Neurological:     General: No focal deficit present.     Mental Status: She is alert and oriented to person, place, and time.  Psychiatric:        Mood and Affect: Mood normal.    Assessment and Plan:  Virginia Doyle is a 33 y.o. 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 Discussed with patient paucity of clear evidence on treatment regimen for pregnant contacts to ureaplasma. 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 Discussed that we do not currently offer ureaplasma testing here at [facility] and that we specifically do not have a way to test. All questions  answered.   Orders: -     Azithromycin ; Take 2 tablets (1,000 mg total) by mouth daily for 1 day, THEN 1 tablet (500 mg total) in the morning and at bedtime for 3 days.  Patient accepted the following screenings: None Patient meets criteria for HepB screening? No. Ordered? not applicable Patient meets criteria for HepC screening? No. Ordered? not applicable  Recommended condom use with all sex for STI prevention.   No follow-ups on file.  No future appointments.  Betsey CHRISTELLA Helling, MD

## 2024-05-31 ENCOUNTER — Other Ambulatory Visit

## 2024-08-09 LAB — PANORAMA PRENATAL TEST FULL PANEL:PANORAMA TEST PLUS 5 ADDITIONAL MICRODELETIONS: FETAL FRACTION: 7.4

## 2024-11-17 ENCOUNTER — Emergency Department
Admission: EM | Admit: 2024-11-17 | Discharge: 2024-11-17 | Disposition: A | Discharge status: Home / Self Care | Attending: Emergency Medicine | Admitting: Emergency Medicine

## 2024-11-17 DIAGNOSIS — J111 Influenza due to unidentified influenza virus with other respiratory manifestations: Secondary | ICD-10-CM | POA: Diagnosis not present

## 2024-11-17 DIAGNOSIS — O26893 Other specified pregnancy related conditions, third trimester: Secondary | ICD-10-CM | POA: Diagnosis present

## 2024-11-17 DIAGNOSIS — R059 Cough, unspecified: Secondary | ICD-10-CM | POA: Insufficient documentation

## 2024-11-17 DIAGNOSIS — O99513 Diseases of the respiratory system complicating pregnancy, third trimester: Secondary | ICD-10-CM | POA: Diagnosis not present

## 2024-11-17 DIAGNOSIS — Z3A31 31 weeks gestation of pregnancy: Secondary | ICD-10-CM | POA: Diagnosis not present

## 2024-11-17 LAB — RESP PANEL BY RT-PCR (RSV, FLU A&B, COVID)  RVPGX2
Influenza A by PCR: POSITIVE — AB
Influenza B by PCR: NEGATIVE
Resp Syncytial Virus by PCR: NEGATIVE
SARS Coronavirus 2 by RT PCR: NEGATIVE

## 2024-11-17 MED ORDER — SODIUM CHLORIDE 0.9 % IV BOLUS
1000.0000 mL | Freq: Once | INTRAVENOUS | Status: AC
  Administered 2024-11-17: 1000 mL via INTRAVENOUS

## 2024-11-17 NOTE — ED Triage Notes (Signed)
 Pt presents to the ED via POV from home with chills, HA, congestion, and body aches x2 days. Denies fevers. Pt is [redacted]w[redacted]d.   FHT 156

## 2024-11-17 NOTE — Progress Notes (Signed)
 ED Visit with requested NST  NST: FHR baseline: 150 bpm Variability: moderate Accelerations: yes Decelerations: none Time: 20 minutes Category/reactivity: reactive   TOCO: quiet SVE: deferred      Assessment:   33 y.o. [redacted]w[redacted]d with high risk pregnancy diagnosed with Flu  Plan Follow up with prenatal clinic  DELON COE, CNM 11/17/2024 6:07 PM

## 2024-11-17 NOTE — Progress Notes (Signed)
 RN at bedside to complete NST. Category 1 tracing - baseline 150, accels present, decels absent. No contractions noted.

## 2024-11-17 NOTE — ED Provider Notes (Signed)
 "  [facility] Provider Note    Event Date/Time   First MD Initiated Contact with Patient 11/17/24 1548     (approximate)   History   Chills   HPI  Virginia Doyle is a 33 y.o. female  at [redacted] weeks pregnant who presents to the emergency department today with flu like illness. She states for the past two days she has been having body aches, cough, chills. Her children had been sick as well. She denies any fevers. Has noticed some abdominal discomfort. She says that she does not feel like it feels like labor but was concerned.       Physical Exam   Triage Vital Signs: ED Triage Vitals  Encounter Vitals Group     BP 11/17/24 1512 118/80     Girls Systolic BP Percentile --      Girls Diastolic BP Percentile --      Boys Systolic BP Percentile --      Boys Diastolic BP Percentile --      Pulse Rate 11/17/24 1512 (!) 124     Resp 11/17/24 1512 20     Temp 11/17/24 1512 99.1 F (37.3 C)     Temp Source 11/17/24 1512 Oral     SpO2 11/17/24 1512 100 %     Weight 11/17/24 1518 273 lb (123.8 kg)     Height 11/17/24 1518 5' 8 (1.727 m)     Head Circumference --      Peak Flow --      Pain Score 11/17/24 1518 6     Pain Loc --      Pain Education --      Exclude from Growth Chart --     Most recent vital signs: Vitals:   11/17/24 1512  BP: 118/80  Pulse: (!) 124  Resp: 20  Temp: 99.1 F (37.3 C)  SpO2: 100%   General: Awake, alert, oriented. CV:  Good peripheral perfusion. Tachycardia. Resp:  Normal effort. Lungs clear. Abd:  No distention. Gravid.   ED Results / Procedures / Treatments   Labs (all labs ordered are listed, but only abnormal results are displayed) Labs Reviewed  RESP PANEL BY RT-PCR (RSV, FLU A&B, COVID)  RVPGX2 - Abnormal; Notable for the following components:      Result Value   Influenza A by PCR POSITIVE (*)    All other components within normal limits      EKG  None   RADIOLOGY None   PROCEDURES:  Critical Care performed: No    MEDICATIONS ORDERED IN ED: Medications - No data to display   IMPRESSION / MDM / ASSESSMENT AND PLAN / ED COURSE  I reviewed the triage vital signs and the nursing notes.                              Differential diagnosis includes, but is not limited to, influenza, covid, other viral URI, dehydration  Patient's presentation is most consistent with acute presentation with potential threat to life or bodily function.   Patient presented to the emergency department today with signs and symptoms concerning for viral URI.  Patient did test positive for influenza.  She however also was complaining of some abdominal discomfort.  Did contact OB/GYN who performed an NST without any concerning findings.  Patient was given IV fluids here in the emergency department with good improvement in her heart rate.  Did discuss positive  flu test with the patient.  Will plan on discharge home.   FINAL CLINICAL IMPRESSION(S) / ED DIAGNOSES   Final diagnoses:  Influenza      Note:  This document was prepared using Dragon voice recognition software and may include unintentional dictation errors.    Floy Roberts, MD 11/17/24 [phone]  "
